# Patient Record
Sex: Male | Born: 1950 | Race: White | Hispanic: No | Marital: Married | Smoking: Former smoker
Health system: Southern US, Community
[De-identification: ages and names within clinical notes are randomized; demographics above are authoritative.]

## PROBLEM LIST (undated history)

## (undated) DIAGNOSIS — C801 Malignant (primary) neoplasm, unspecified: Secondary | ICD-10-CM

## (undated) DIAGNOSIS — T8859XA Other complications of anesthesia, initial encounter: Secondary | ICD-10-CM

## (undated) DIAGNOSIS — I10 Essential (primary) hypertension: Secondary | ICD-10-CM

## (undated) DIAGNOSIS — M1990 Unspecified osteoarthritis, unspecified site: Secondary | ICD-10-CM

## (undated) DIAGNOSIS — G4730 Sleep apnea, unspecified: Secondary | ICD-10-CM

## (undated) DIAGNOSIS — R7303 Prediabetes: Secondary | ICD-10-CM

## (undated) DIAGNOSIS — E7800 Pure hypercholesterolemia, unspecified: Secondary | ICD-10-CM

## (undated) DIAGNOSIS — K219 Gastro-esophageal reflux disease without esophagitis: Secondary | ICD-10-CM

## (undated) DIAGNOSIS — T4145XA Adverse effect of unspecified anesthetic, initial encounter: Secondary | ICD-10-CM

## (undated) HISTORY — PX: SHOULDER SURGERY: SHX246

## (undated) HISTORY — PX: APPENDECTOMY: SHX54

## (undated) HISTORY — PX: HIP OPEN REDUCTION: SHX1755

## (undated) HISTORY — PX: HIP SURGERY: SHX245

## (undated) HISTORY — PX: FOOT SURGERY: SHX648

## (undated) HISTORY — PX: JOINT REPLACEMENT: SHX530

---

## 1997-12-17 ENCOUNTER — Observation Stay (HOSPITAL_COMMUNITY): Admission: EM | Admit: 1997-12-17 | Discharge: 1997-12-17 | Payer: Self-pay | Admitting: Emergency Medicine

## 1997-12-17 ENCOUNTER — Encounter: Payer: Self-pay | Admitting: Emergency Medicine

## 2001-02-22 ENCOUNTER — Encounter: Payer: Self-pay | Admitting: Emergency Medicine

## 2001-02-22 ENCOUNTER — Emergency Department (HOSPITAL_COMMUNITY): Admission: EM | Admit: 2001-02-22 | Discharge: 2001-02-22 | Payer: Self-pay | Admitting: Emergency Medicine

## 2007-12-05 ENCOUNTER — Emergency Department (HOSPITAL_BASED_OUTPATIENT_CLINIC_OR_DEPARTMENT_OTHER): Admission: EM | Admit: 2007-12-05 | Discharge: 2007-12-05 | Payer: Self-pay | Admitting: Emergency Medicine

## 2010-07-29 ENCOUNTER — Emergency Department (HOSPITAL_BASED_OUTPATIENT_CLINIC_OR_DEPARTMENT_OTHER)
Admission: EM | Admit: 2010-07-29 | Discharge: 2010-07-29 | Disposition: A | Discharge status: Home / Self Care | Payer: BC Managed Care – PPO | Attending: Emergency Medicine | Admitting: Emergency Medicine

## 2010-07-29 ENCOUNTER — Encounter: Payer: Self-pay | Admitting: Emergency Medicine

## 2010-07-29 ENCOUNTER — Emergency Department (INDEPENDENT_AMBULATORY_CARE_PROVIDER_SITE_OTHER): Payer: BC Managed Care – PPO

## 2010-07-29 DIAGNOSIS — R071 Chest pain on breathing: Secondary | ICD-10-CM | POA: Insufficient documentation

## 2010-07-29 DIAGNOSIS — R05 Cough: Secondary | ICD-10-CM | POA: Insufficient documentation

## 2010-07-29 DIAGNOSIS — S2239XA Fracture of one rib, unspecified side, initial encounter for closed fracture: Secondary | ICD-10-CM

## 2010-07-29 DIAGNOSIS — Z79899 Other long term (current) drug therapy: Secondary | ICD-10-CM | POA: Insufficient documentation

## 2010-07-29 DIAGNOSIS — K219 Gastro-esophageal reflux disease without esophagitis: Secondary | ICD-10-CM | POA: Insufficient documentation

## 2010-07-29 DIAGNOSIS — R059 Cough, unspecified: Secondary | ICD-10-CM | POA: Insufficient documentation

## 2010-07-29 DIAGNOSIS — Z9889 Other specified postprocedural states: Secondary | ICD-10-CM | POA: Insufficient documentation

## 2010-07-29 DIAGNOSIS — Z8613 Personal history of malaria: Secondary | ICD-10-CM | POA: Insufficient documentation

## 2010-07-29 DIAGNOSIS — M810 Age-related osteoporosis without current pathological fracture: Secondary | ICD-10-CM | POA: Insufficient documentation

## 2010-07-29 DIAGNOSIS — X500XXA Overexertion from strenuous movement or load, initial encounter: Secondary | ICD-10-CM | POA: Insufficient documentation

## 2010-07-29 DIAGNOSIS — I10 Essential (primary) hypertension: Secondary | ICD-10-CM | POA: Insufficient documentation

## 2010-07-29 DIAGNOSIS — R10819 Abdominal tenderness, unspecified site: Secondary | ICD-10-CM | POA: Insufficient documentation

## 2010-07-29 DIAGNOSIS — J40 Bronchitis, not specified as acute or chronic: Secondary | ICD-10-CM

## 2010-07-29 DIAGNOSIS — R109 Unspecified abdominal pain: Secondary | ICD-10-CM | POA: Insufficient documentation

## 2010-07-29 DIAGNOSIS — X58XXXA Exposure to other specified factors, initial encounter: Secondary | ICD-10-CM

## 2010-07-29 HISTORY — DX: Essential (primary) hypertension: I10

## 2010-07-29 HISTORY — DX: Gastro-esophageal reflux disease without esophagitis: K21.9

## 2010-07-29 MED ORDER — IBUPROFEN 800 MG PO TABS: 800.0000 mg | ORAL_TABLET | Freq: Three times a day (TID) | ORAL | Status: AC

## 2010-07-29 MED ORDER — ALBUTEROL SULFATE HFA 108 (90 BASE) MCG/ACT IN AERS: 1.0000 | INHALATION_SPRAY | Freq: Four times a day (QID) | RESPIRATORY_TRACT | Status: DC | PRN

## 2010-07-29 MED ORDER — HYDROCODONE-ACETAMINOPHEN 5-500 MG PO TABS: 1.0000 | ORAL_TABLET | Freq: Four times a day (QID) | ORAL | Status: AC | PRN

## 2010-07-29 MED ORDER — KETOROLAC TROMETHAMINE 60 MG/2ML IM SOLN
60.0000 mg | Freq: Once | INTRAMUSCULAR | Status: AC
  Administered 2010-07-29: 60 mg via INTRAMUSCULAR
  Filled 2010-07-29: qty 2

## 2010-07-29 NOTE — ED Notes (Signed)
Pt states he has been having cold symptoms x 2 weeks, with coughing.  Non- productive.  Pt having chest discomfort with cough.  Some fever.  Now having right upper quad abd pain/right rib pain with coughing today.

## 2010-07-29 NOTE — ED Provider Notes (Signed)
History     Chief Complaint  Patient presents with  . Cough  . Pleurisy   HPI Comments: Patient states that after a world tour of both in the in [place] in the last month he has developed a cough. Originally it was related with a fever. The fever has now gone away and he has been left with a mild nonproductive cough which seems to be improving with over-the-counter pain medications. This morning when he had a coughing fit he noticed that he developed right-sided pain that has been moderate. The pain is worse with palpation of the right chest and upper abdomen and worse with deep breathing. It is a sharp pain.  Patient is a 60 y.o. male presenting with cough. The history is provided by the patient and a relative.  Cough Chronicity: Subacute. The current episode started 1 to 2 hours ago. The problem occurs constantly. The problem has not changed since onset.The cough is non-productive. There has been no fever. Associated symptoms include chest pain. Pertinent negatives include no chills, no sweats, no rhinorrhea, no sore throat and no wheezing. Treatments tried: Over-the-counter medicines Eucalyptus drops. The treatment provided mild relief. His past medical history does not include pneumonia or COPD.    Past Medical History  Diagnosis Date  . Hypertension   . GERD (gastroesophageal reflux disease)   . Osteoporosis   . Malaria     Past Surgical History  Procedure Date  . Hip open reduction   . Appendectomy   . Joint replacement     History reviewed. No pertinent family history.  History  Substance Use Topics  . Smoking status: Never Smoker   . Smokeless tobacco: Not on file  . Alcohol Use: Yes     occasional      Review of Systems  Constitutional: Negative for chills.  HENT: Negative for sore throat and rhinorrhea.   Respiratory: Positive for cough. Negative for wheezing.   Cardiovascular: Positive for chest pain.    Physical Exam  BP 154/92  Pulse 72  Temp(Src) 97.4  F (36.3 C) (Oral)  Resp 22  Wt 246 lb (111.585 kg)  SpO2 97%  Physical Exam  Nursing note and vitals reviewed. Constitutional: He appears well-developed and well-nourished. No distress.  HENT:  Head: Normocephalic and atraumatic.  Mouth/Throat: Oropharynx is clear and moist. No oropharyngeal exudate.  Eyes: Conjunctivae and EOM are normal. Pupils are equal, round, and reactive to light. Right eye exhibits no discharge. Left eye exhibits no discharge. No scleral icterus.  Neck: Normal range of motion. Neck supple. No JVD present. No thyromegaly present.  Cardiovascular: Normal rate, regular rhythm, normal heart sounds and intact distal pulses.  Exam reveals no gallop and no friction rub.   No murmur heard. Pulmonary/Chest: Effort normal and breath sounds normal. No respiratory distress. He has no wheezes. He has no rales. He exhibits tenderness ( EndoTIP outpatient over the right lower anterior and lateral chest wall.).  Abdominal: Soft. Bowel sounds are normal. He exhibits no distension and no mass. There is tenderness.       Tenderness to the right upper quadrant right anterior abdominal wall. Positive carnett's sign   Musculoskeletal: Normal range of motion. He exhibits no edema and no tenderness.  Lymphadenopathy:    He has no cervical adenopathy.  Neurological: He is alert. Coordination normal.  Skin: Skin is warm and dry. No rash noted. He is not diaphoretic. No erythema.  Psychiatric: He has a normal mood and affect. His behavior is  normal.    ED Course  Procedures  MDM Patient has acute onset of right lower chest pain and right upper abdominal pain related to coughing. I suspect that he is strained a muscle in his body wall or possibly had a small fracture of her rib. Will obtain x-ray imaging and give intramuscular Toradol for pain relief.   Findings reviewed with patient, incentive spirometry were given, pain medicine given in the emergency department.  Definitive  Fracture Care:  Definitive fracture care performred for therib.  This included analgesia in the ED, incentive spiromoterly, and pain medication which have been provided.  I have counseled the pt on possible complications of the fractures and signs and symptoms which would mandate return for further care.   Vida Roller, MD 07/29/10 [phone]

## 2012-01-15 DIAGNOSIS — C801 Malignant (primary) neoplasm, unspecified: Secondary | ICD-10-CM

## 2012-01-15 HISTORY — DX: Malignant (primary) neoplasm, unspecified: C80.1

## 2012-03-14 HISTORY — PX: PROSTATECTOMY: SHX69

## 2012-07-09 ENCOUNTER — Encounter (HOSPITAL_COMMUNITY): Payer: Self-pay | Admitting: Pharmacy Technician

## 2012-07-13 ENCOUNTER — Encounter (HOSPITAL_COMMUNITY): Payer: Self-pay

## 2012-07-13 ENCOUNTER — Encounter (HOSPITAL_COMMUNITY)
Admission: RE | Admit: 2012-07-13 | Discharge: 2012-07-13 | Disposition: A | Discharge status: Home / Self Care | Payer: BC Managed Care – PPO | Source: Ambulatory Visit | Attending: Orthopedic Surgery | Admitting: Orthopedic Surgery

## 2012-07-13 DIAGNOSIS — X58XXXA Exposure to other specified factors, initial encounter: Secondary | ICD-10-CM | POA: Insufficient documentation

## 2012-07-13 DIAGNOSIS — Z01812 Encounter for preprocedural laboratory examination: Secondary | ICD-10-CM | POA: Insufficient documentation

## 2012-07-13 DIAGNOSIS — S7290XA Unspecified fracture of unspecified femur, initial encounter for closed fracture: Secondary | ICD-10-CM | POA: Insufficient documentation

## 2012-07-13 HISTORY — DX: Other complications of anesthesia, initial encounter: T88.59XA

## 2012-07-13 HISTORY — DX: Pure hypercholesterolemia, unspecified: E78.00

## 2012-07-13 HISTORY — DX: Adverse effect of unspecified anesthetic, initial encounter: T41.45XA

## 2012-07-13 HISTORY — DX: Sleep apnea, unspecified: G47.30

## 2012-07-13 HISTORY — DX: Unspecified osteoarthritis, unspecified site: M19.90

## 2012-07-13 HISTORY — DX: Malignant (primary) neoplasm, unspecified: C80.1

## 2012-07-13 LAB — CBC
Hemoglobin: 14.8 g/dL (ref 13.0–17.0)
MCHC: 33.9 g/dL (ref 30.0–36.0)
RBC: 5.06 MIL/uL (ref 4.22–5.81)
WBC: 9.4 10*3/uL (ref 4.0–10.5)

## 2012-07-13 LAB — BASIC METABOLIC PANEL
GFR calc Af Amer: >90 mL/min (ref >90)
GFR calc non Af Amer: >90 mL/min (ref >90)
Potassium: 3.6 mEq/L (ref 3.5–5.1)
Sodium: 138 mEq/L (ref 135–145)

## 2012-07-13 LAB — URINALYSIS, ROUTINE W REFLEX MICROSCOPIC
Bilirubin Urine: NEGATIVE
Nitrite: NEGATIVE
Specific Gravity, Urine: 1.029 (ref 1.005–1.030)
Urobilinogen, UA: 0.2 mg/dL (ref 0.0–1.0)

## 2012-07-13 LAB — APTT: aPTT: 29 seconds (ref 24–37)

## 2012-07-13 LAB — SURGICAL PCR SCREEN
MRSA, PCR: NEGATIVE
Staphylococcus aureus: NEGATIVE

## 2012-07-13 LAB — PROTIME-INR
INR: 1.05 (ref 0.00–1.49)
Prothrombin Time: 13.5 seconds (ref 11.6–15.2)

## 2012-07-13 NOTE — Progress Notes (Signed)
Surgery clearance note Dr. Riley Nearing on chart, EKG 11/01/11 and 03/19/12 on chart, Chest x-ray 03/19/12 on chart, CBC with diff and CMET results 06/29/12 on chart, [facility] chart information on chart, LOV note 11/01/11 Dr. Riley Nearing on chart

## 2012-07-13 NOTE — Patient Instructions (Addendum)
20 Gregory Norton  07/13/2012   Your procedure is scheduled on: 07/20/12  Report to [facility] at 7:30 AM.  Call this number if you have problems the morning of surgery [phone]: [phone]   Remember:   Do not eat food or drink liquids After Midnight.     Take these medicines the morning of surgery with A SIP OF WATER: protonix   Do not wear jewelry, make-up or nail polish.  Do not wear lotions, powders, or perfumes. You may wear deodorant.  Do not shave 48 hours prior to surgery. Men may shave face and neck.  Do not bring valuables to the hospital.  Contacts, dentures or bridgework may not be worn into surgery.  Leave suitcase in the car. After surgery it may be brought to your room.  For patients admitted to the hospital, checkout time is 11:00 AM the day of discharge.    Please read over the following fact sheets that you were given: MRSA Information, blood fact sheet, incentive spirometry fact sheet Birdie Sons, RN  pre op nurse call if needed [phone]    FAILURE TO FOLLOW THESE INSTRUCTIONS MAY RESULT IN CANCELLATION OF YOUR SURGERY   Patient Signature: ___________________________________________

## 2012-07-19 NOTE — H&P (Signed)
TOTAL HIP REVISION ADMISSION H&P  Patient is admitted for left revision total hip arthroplasty, with ORIF.  Subjective:  Chief Complaint: left hip pain  HPI: Gregory Norton, 62 y.o. male, has a history of pain and functional disability in the left hip due to multiple dislocations as well as multiple fractures and patient has failed non-surgical conservative treatments for greater than 12 weeks to include NSAID's and/or analgesics, flexibility and strengthening excercises, supervised PT with diminished ADL's post treatment, use of assistive devices and activity modification. The indications for the revision total hip arthroplasty are fracture or mechanical failure of one or more component.  Onset of symptoms was abrupt starting >10 years ago with rapidlly worsening course since that time.  Prior procedures on the left hip include arthroscopy and ORIF with plate, screws and cables.  Patient currently rates pain in the left hip at 9 out of 10 with activity.  There is night pain, worsening of pain with activity and weight bearing, trendelenberg gait, pain that interfers with activities of daily living and pain with passive range of motion. Patient has evidence of the previous total hip arthroplasy as well as plate, screws and cables by imaging studies.  This condition presents safety issues increasing the risk of falls.  This patient has had proximal femur fracture and multiple dislocations.  There is no current active signs of infection.  Risks, benefits and expectations were discussed with the patient. Patient understand the risks, benefits and expectations and wishes to proceed with surgery.  D/C Plans:   Home with HHPT  Post-op Meds:    Rx given for ASA, Zanaflex, Iron, Colace and MiraLax  Tranexamic Acid:   To be given  Decadron:    To be given  FYI:    ASA post-op   Past Medical History  Diagnosis Date  . Hypertension   . GERD (gastroesophageal reflux disease)   . Osteoporosis   .  Hypercholesteremia   . Sleep apnea     no CPAP  . Cancer 2014    prostate cancer  . Arthritis   . Complication of anesthesia     low O2 sats, could not wake up after hip surgery    Past Surgical History  Procedure Laterality Date  . Hip open reduction Left   . Appendectomy    . Joint replacement    . Prostatectomy  March 2014  . Foot surgery Bilateral 10 years ago    6 surgeries after crushing both feet  . Shoulder surgery Right at 62 years old  . Hip surgery Right at 63 years old    No Known Allergies   History  Substance Use Topics  . Smoking status: Former Smoker -- 15 years    Types: Cigarettes    Quit date: 01/14/1978  . Smokeless tobacco: Never Used  . Alcohol Use: Yes     Comment: social    No family history on file.    Review of Systems  Constitutional: Negative.   HENT: Negative.   Eyes: Negative.   Respiratory: Negative.   Cardiovascular: Negative.   Gastrointestinal: Positive for heartburn.  Genitourinary: Positive for frequency.  Musculoskeletal: Positive for joint pain.  Skin: Negative.   Neurological: Negative.   Endo/Heme/Allergies: Negative.   Psychiatric/Behavioral: Negative.     Objective:  Physical Exam  Constitutional: He is oriented to person, place, and time. He appears well-developed and well-nourished.  HENT:  Head: Normocephalic and atraumatic.  Mouth/Throat: Oropharynx is clear and moist.  Eyes: Pupils are equal,  round, and reactive to light.  Neck: Neck supple. No JVD present. No tracheal deviation present. No thyromegaly present.  Cardiovascular: Normal rate, regular rhythm, normal heart sounds and intact distal pulses.   Respiratory: Effort normal and breath sounds normal. No stridor. No respiratory distress. He has no wheezes.  GI: Soft. There is no tenderness. There is no guarding.  Musculoskeletal:       Left hip: He exhibits decreased range of motion, decreased strength, tenderness, bony tenderness, swelling and  laceration. He exhibits no crepitus and no deformity.  Lymphadenopathy:    He has no cervical adenopathy.  Neurological: He is alert and oriented to person, place, and time.  Skin: Skin is warm and dry.  Psychiatric: He has a normal mood and affect.    Imaging Review:  Plain radiographs demonstrate previous total hip arthroplasty, show nonunion of a periprosthetic fracture that was ORIF with a plate, cables and screws of the left hip(s). The bone quality appears to be fair for age and reported activity level.   Assessment/Plan:  End stage arthritis, left hip(s) with failed previous arthroplasty.  The patient history, physical examination, clinical judgement of the provider and imaging studies are consistent with end stage degenerative joint disease of the left hip(s), previous total hip arthroplasty and ORIF from fractures. Revision total hip arthroplasty with ORIF is deemed medically necessary. The treatment options including medical management, injection therapy, arthroscopy and arthroplasty were discussed at length. The risks and benefits of total hip arthroplasty were presented and reviewed. The risks due to aseptic loosening, infection, stiffness, dislocation/subluxation,  thromboembolic complications and other imponderables were discussed.  The patient acknowledged the explanation, agreed to proceed with the plan and consent was signed. Patient is being admitted for inpatient treatment for surgery, pain control, PT, OT, prophylactic antibiotics, VTE prophylaxis, progressive ambulation and ADL's and discharge planning. The patient is planning to be discharged home with home health services.     [name]   PAC  07/19/2012, 10:04 AM

## 2012-07-20 ENCOUNTER — Inpatient Hospital Stay (HOSPITAL_COMMUNITY)
Admission: RE | Admit: 2012-07-20 | Discharge: 2012-07-22 | DRG: 817 | Disposition: A | Discharge status: Home Health | Payer: BC Managed Care – PPO | Source: Ambulatory Visit | Attending: Orthopedic Surgery | Admitting: Orthopedic Surgery

## 2012-07-20 ENCOUNTER — Inpatient Hospital Stay (HOSPITAL_COMMUNITY): Payer: BC Managed Care – PPO

## 2012-07-20 ENCOUNTER — Encounter (HOSPITAL_COMMUNITY): Payer: Self-pay | Admitting: *Deleted

## 2012-07-20 ENCOUNTER — Encounter (HOSPITAL_COMMUNITY): Payer: Self-pay | Admitting: Anesthesiology

## 2012-07-20 ENCOUNTER — Encounter (HOSPITAL_COMMUNITY)
Admission: RE | Disposition: A | Discharge status: Home Health | Payer: Self-pay | Source: Ambulatory Visit | Attending: Orthopedic Surgery

## 2012-07-20 ENCOUNTER — Inpatient Hospital Stay (HOSPITAL_COMMUNITY): Payer: BC Managed Care – PPO | Admitting: Anesthesiology

## 2012-07-20 DIAGNOSIS — Z87891 Personal history of nicotine dependence: Secondary | ICD-10-CM

## 2012-07-20 DIAGNOSIS — Y831 Surgical operation with implant of artificial internal device as the cause of abnormal reaction of the patient, or of later complication, without mention of misadventure at the time of the procedure: Secondary | ICD-10-CM | POA: Diagnosis present

## 2012-07-20 DIAGNOSIS — E7800 Pure hypercholesterolemia, unspecified: Secondary | ICD-10-CM | POA: Diagnosis present

## 2012-07-20 DIAGNOSIS — Z01812 Encounter for preprocedural laboratory examination: Secondary | ICD-10-CM

## 2012-07-20 DIAGNOSIS — E871 Hypo-osmolality and hyponatremia: Secondary | ICD-10-CM | POA: Diagnosis not present

## 2012-07-20 DIAGNOSIS — S72009S Fracture of unspecified part of neck of unspecified femur, sequela: Secondary | ICD-10-CM

## 2012-07-20 DIAGNOSIS — W19XXXS Unspecified fall, sequela: Secondary | ICD-10-CM

## 2012-07-20 DIAGNOSIS — E669 Obesity, unspecified: Secondary | ICD-10-CM | POA: Diagnosis present

## 2012-07-20 DIAGNOSIS — D62 Acute posthemorrhagic anemia: Secondary | ICD-10-CM | POA: Diagnosis not present

## 2012-07-20 DIAGNOSIS — K219 Gastro-esophageal reflux disease without esophagitis: Secondary | ICD-10-CM | POA: Diagnosis present

## 2012-07-20 DIAGNOSIS — T84049A Periprosthetic fracture around unspecified internal prosthetic joint, initial encounter: Secondary | ICD-10-CM | POA: Diagnosis present

## 2012-07-20 DIAGNOSIS — G4730 Sleep apnea, unspecified: Secondary | ICD-10-CM | POA: Diagnosis present

## 2012-07-20 DIAGNOSIS — IMO0002 Reserved for concepts with insufficient information to code with codable children: Principal | ICD-10-CM | POA: Diagnosis present

## 2012-07-20 DIAGNOSIS — D500 Iron deficiency anemia secondary to blood loss (chronic): Secondary | ICD-10-CM | POA: Diagnosis not present

## 2012-07-20 DIAGNOSIS — Z96649 Presence of unspecified artificial hip joint: Secondary | ICD-10-CM

## 2012-07-20 DIAGNOSIS — I10 Essential (primary) hypertension: Secondary | ICD-10-CM | POA: Diagnosis present

## 2012-07-20 DIAGNOSIS — Z6835 Body mass index (BMI) 35.0-35.9, adult: Secondary | ICD-10-CM

## 2012-07-20 DIAGNOSIS — M1610 Unilateral primary osteoarthritis, unspecified hip: Secondary | ICD-10-CM | POA: Diagnosis present

## 2012-07-20 DIAGNOSIS — M169 Osteoarthritis of hip, unspecified: Secondary | ICD-10-CM | POA: Diagnosis present

## 2012-07-20 DIAGNOSIS — M810 Age-related osteoporosis without current pathological fracture: Secondary | ICD-10-CM | POA: Diagnosis present

## 2012-07-20 HISTORY — PX: TOTAL HIP REVISION: SHX763

## 2012-07-20 LAB — TYPE AND SCREEN: Antibody Screen: NEGATIVE

## 2012-07-20 SURGERY — TOTAL HIP REVISION
Anesthesia: General | Site: Hip | Laterality: Left | Wound class: Clean

## 2012-07-20 MED ORDER — SIMVASTATIN 10 MG PO TABS
10.0000 mg | ORAL_TABLET | Freq: Every evening | ORAL | Status: DC
  Administered 2012-07-20 – 2012-07-21 (×2): 10 mg via ORAL
  Filled 2012-07-20 (×3): qty 1

## 2012-07-20 MED ORDER — DEXTROSE 5 % IV SOLN
3.0000 g | INTRAVENOUS | Status: AC
  Administered 2012-07-20: 3 g via INTRAVENOUS
  Filled 2012-07-20: qty 3000

## 2012-07-20 MED ORDER — DEXAMETHASONE SODIUM PHOSPHATE 10 MG/ML IJ SOLN
10.0000 mg | Freq: Once | INTRAMUSCULAR | Status: AC
  Administered 2012-07-21: 10 mg via INTRAVENOUS
  Filled 2012-07-20: qty 1

## 2012-07-20 MED ORDER — METOCLOPRAMIDE HCL 5 MG PO TABS
5.0000 mg | ORAL_TABLET | Freq: Three times a day (TID) | ORAL | Status: DC | PRN
  Filled 2012-07-20: qty 2

## 2012-07-20 MED ORDER — METHOCARBAMOL 100 MG/ML IJ SOLN
500.0000 mg | Freq: Four times a day (QID) | INTRAVENOUS | Status: DC | PRN
  Administered 2012-07-20: 500 mg via INTRAVENOUS
  Filled 2012-07-20 (×2): qty 5

## 2012-07-20 MED ORDER — NEOSTIGMINE METHYLSULFATE 1 MG/ML IJ SOLN
INTRAMUSCULAR | Status: DC | PRN
  Administered 2012-07-20: 4 mg via INTRAVENOUS

## 2012-07-20 MED ORDER — HYDROMORPHONE HCL PF 1 MG/ML IJ SOLN
INTRAMUSCULAR | Status: AC
  Administered 2012-07-20: 0.5 mg via INTRAVENOUS
  Filled 2012-07-20: qty 1

## 2012-07-20 MED ORDER — METOCLOPRAMIDE HCL 5 MG/ML IJ SOLN: 5.0000 mg | Freq: Three times a day (TID) | INTRAMUSCULAR | Status: DC | PRN

## 2012-07-20 MED ORDER — ONDANSETRON HCL 4 MG/2ML IJ SOLN
INTRAMUSCULAR | Status: DC | PRN
  Administered 2012-07-20: 4 mg via INTRAVENOUS

## 2012-07-20 MED ORDER — HYDROMORPHONE HCL PF 1 MG/ML IJ SOLN
0.5000 mg | INTRAMUSCULAR | Status: DC | PRN
  Filled 2012-07-20: qty 1

## 2012-07-20 MED ORDER — 0.9 % SODIUM CHLORIDE (POUR BTL) OPTIME
TOPICAL | Status: DC | PRN
  Administered 2012-07-20: 1000 mL

## 2012-07-20 MED ORDER — POLYETHYLENE GLYCOL 3350 17 G PO PACK
17.0000 g | PACK | Freq: Two times a day (BID) | ORAL | Status: DC
  Administered 2012-07-20 – 2012-07-22 (×4): 17 g via ORAL

## 2012-07-20 MED ORDER — PHENOL 1.4 % MT LIQD: 1.0000 | OROMUCOSAL | Status: DC | PRN

## 2012-07-20 MED ORDER — BISACODYL 10 MG RE SUPP: 10.0000 mg | Freq: Every day | RECTAL | Status: DC | PRN

## 2012-07-20 MED ORDER — METHOCARBAMOL 500 MG PO TABS
500.0000 mg | ORAL_TABLET | Freq: Four times a day (QID) | ORAL | Status: DC | PRN
  Administered 2012-07-21 – 2012-07-22 (×5): 500 mg via ORAL
  Filled 2012-07-20 (×5): qty 1

## 2012-07-20 MED ORDER — TRANEXAMIC ACID 100 MG/ML IV SOLN
1000.0000 mg | Freq: Once | INTRAVENOUS | Status: AC
  Administered 2012-07-20: 1000 mg via INTRAVENOUS
  Filled 2012-07-20: qty 10

## 2012-07-20 MED ORDER — DEXAMETHASONE SODIUM PHOSPHATE 10 MG/ML IJ SOLN
10.0000 mg | Freq: Once | INTRAMUSCULAR | Status: AC
  Administered 2012-07-20: 10 mg via INTRAVENOUS

## 2012-07-20 MED ORDER — PANTOPRAZOLE SODIUM 40 MG PO TBEC
40.0000 mg | DELAYED_RELEASE_TABLET | Freq: Every morning | ORAL | Status: DC
  Administered 2012-07-21: 40 mg via ORAL
  Filled 2012-07-20 (×2): qty 1

## 2012-07-20 MED ORDER — CEFAZOLIN SODIUM-DEXTROSE 2-3 GM-% IV SOLR: 2.0000 g | Freq: Four times a day (QID) | INTRAVENOUS | Status: DC

## 2012-07-20 MED ORDER — ONDANSETRON HCL 4 MG/2ML IJ SOLN
4.0000 mg | Freq: Four times a day (QID) | INTRAMUSCULAR | Status: DC | PRN
  Administered 2012-07-21: 4 mg via INTRAVENOUS
  Filled 2012-07-20: qty 2

## 2012-07-20 MED ORDER — MENTHOL 3 MG MT LOZG: 1.0000 | LOZENGE | OROMUCOSAL | Status: DC | PRN

## 2012-07-20 MED ORDER — FLEET ENEMA 7-19 GM/118ML RE ENEM: 1.0000 | ENEMA | Freq: Once | RECTAL | Status: AC | PRN

## 2012-07-20 MED ORDER — HYDROMORPHONE HCL PF 1 MG/ML IJ SOLN
0.2500 mg | INTRAMUSCULAR | Status: DC | PRN
  Administered 2012-07-20 (×3): 0.5 mg via INTRAVENOUS

## 2012-07-20 MED ORDER — GLYCOPYRROLATE 0.2 MG/ML IJ SOLN
INTRAMUSCULAR | Status: DC | PRN
  Administered 2012-07-20: .6 mg via INTRAVENOUS

## 2012-07-20 MED ORDER — CELECOXIB 200 MG PO CAPS
200.0000 mg | ORAL_CAPSULE | Freq: Two times a day (BID) | ORAL | Status: DC
  Administered 2012-07-20 – 2012-07-22 (×4): 200 mg via ORAL
  Filled 2012-07-20 (×5): qty 1

## 2012-07-20 MED ORDER — LACTATED RINGERS IV SOLN
INTRAVENOUS | Status: DC | PRN
  Administered 2012-07-20 (×5): via INTRAVENOUS

## 2012-07-20 MED ORDER — SODIUM CHLORIDE 0.9 % IR SOLN
Status: DC | PRN
  Administered 2012-07-20: 2000 mL

## 2012-07-20 MED ORDER — ALUM & MAG HYDROXIDE-SIMETH 200-200-20 MG/5ML PO SUSP
30.0000 mL | ORAL | Status: DC | PRN
  Administered 2012-07-21: 30 mL via ORAL
  Filled 2012-07-20: qty 30

## 2012-07-20 MED ORDER — PROPOFOL 10 MG/ML IV BOLUS
INTRAVENOUS | Status: DC | PRN
  Administered 2012-07-20: 140 mg via INTRAVENOUS

## 2012-07-20 MED ORDER — PROMETHAZINE HCL 25 MG/ML IJ SOLN: 6.2500 mg | INTRAMUSCULAR | Status: DC | PRN

## 2012-07-20 MED ORDER — ROCURONIUM BROMIDE 100 MG/10ML IV SOLN
INTRAVENOUS | Status: DC | PRN
  Administered 2012-07-20: 30 mg via INTRAVENOUS

## 2012-07-20 MED ORDER — FENTANYL CITRATE 0.05 MG/ML IJ SOLN
INTRAMUSCULAR | Status: DC | PRN
  Administered 2012-07-20: 50 ug via INTRAVENOUS
  Administered 2012-07-20: 100 ug via INTRAVENOUS
  Administered 2012-07-20: 50 ug via INTRAVENOUS
  Administered 2012-07-20 (×2): 25 ug via INTRAVENOUS
  Administered 2012-07-20: 50 ug via INTRAVENOUS
  Administered 2012-07-20 (×2): 25 ug via INTRAVENOUS

## 2012-07-20 MED ORDER — MIDAZOLAM HCL 5 MG/5ML IJ SOLN
INTRAMUSCULAR | Status: DC | PRN
  Administered 2012-07-20: 2 mg via INTRAVENOUS

## 2012-07-20 MED ORDER — SUCCINYLCHOLINE CHLORIDE 20 MG/ML IJ SOLN
INTRAMUSCULAR | Status: DC | PRN
  Administered 2012-07-20: 100 mg via INTRAVENOUS

## 2012-07-20 MED ORDER — LACTATED RINGERS IV SOLN
INTRAVENOUS | Status: DC
  Administered 2012-07-20: 1000 mL via INTRAVENOUS

## 2012-07-20 MED ORDER — EPHEDRINE SULFATE 50 MG/ML IJ SOLN
INTRAMUSCULAR | Status: DC | PRN
  Administered 2012-07-20 (×3): 5 mg via INTRAVENOUS

## 2012-07-20 MED ORDER — ZOLPIDEM TARTRATE 5 MG PO TABS: 5.0000 mg | ORAL_TABLET | Freq: Every evening | ORAL | Status: DC | PRN

## 2012-07-20 MED ORDER — ONDANSETRON HCL 4 MG PO TABS: 4.0000 mg | ORAL_TABLET | Freq: Four times a day (QID) | ORAL | Status: DC | PRN

## 2012-07-20 MED ORDER — DOCUSATE SODIUM 100 MG PO CAPS
100.0000 mg | ORAL_CAPSULE | Freq: Two times a day (BID) | ORAL | Status: DC
  Administered 2012-07-20 – 2012-07-22 (×4): 100 mg via ORAL

## 2012-07-20 MED ORDER — DIPHENHYDRAMINE HCL 25 MG PO CAPS: 25.0000 mg | ORAL_CAPSULE | Freq: Four times a day (QID) | ORAL | Status: DC | PRN

## 2012-07-20 MED ORDER — HYDROCODONE-ACETAMINOPHEN 7.5-325 MG PO TABS
1.0000 | ORAL_TABLET | ORAL | Status: DC
  Administered 2012-07-20: 1 via ORAL
  Administered 2012-07-20 – 2012-07-21 (×4): 2 via ORAL
  Filled 2012-07-20 (×5): qty 2

## 2012-07-20 MED ORDER — ASPIRIN EC 325 MG PO TBEC
325.0000 mg | DELAYED_RELEASE_TABLET | Freq: Two times a day (BID) | ORAL | Status: DC
  Administered 2012-07-21 – 2012-07-22 (×3): 325 mg via ORAL
  Filled 2012-07-20 (×5): qty 1

## 2012-07-20 MED ORDER — FERROUS SULFATE 325 (65 FE) MG PO TABS
325.0000 mg | ORAL_TABLET | Freq: Three times a day (TID) | ORAL | Status: DC
  Administered 2012-07-20 – 2012-07-22 (×6): 325 mg via ORAL
  Filled 2012-07-20 (×8): qty 1

## 2012-07-20 MED ORDER — SODIUM CHLORIDE 0.9 % IV SOLN
100.0000 mL/h | INTRAVENOUS | Status: DC
  Administered 2012-07-20 – 2012-07-21 (×2): 100 mL/h via INTRAVENOUS
  Filled 2012-07-20 (×7): qty 1000

## 2012-07-20 MED ORDER — DILTIAZEM HCL ER COATED BEADS 300 MG PO CP24
300.0000 mg | ORAL_CAPSULE | Freq: Every evening | ORAL | Status: DC
  Administered 2012-07-20 – 2012-07-21 (×2): 300 mg via ORAL
  Filled 2012-07-20 (×3): qty 1

## 2012-07-20 SURGICAL SUPPLY — 60 items
ARTICULEZE HEAD 36 12 (Hips) ×2 IMPLANT
BAG SPEC THK2 15X12 ZIP CLS (MISCELLANEOUS) ×1
BAG ZIPLOCK 12X15 (MISCELLANEOUS) ×2 IMPLANT
BIT DRILL 2.8X128 (BIT) ×1 IMPLANT
BLADE SAW SGTL 18X1.27X75 (BLADE) ×2 IMPLANT
BLADE SAW SGTL 81X20 HD (BLADE) ×1 IMPLANT
BOWL SMART MIX CTS (DISPOSABLE) ×1 IMPLANT
BUR OVAL CARBIDE 4.0 (BURR) ×1 IMPLANT
CABLE (Orthopedic Implant) ×3 IMPLANT
CATH FOLEY 2WAY SLVR  5CC 16FR (CATHETERS) ×1
CATH FOLEY 2WAY SLVR 5CC 16FR (CATHETERS) IMPLANT
CEMENT HV SMART SET (Cement) ×1 IMPLANT
CLOTH BEACON ORANGE TIMEOUT ST (SAFETY) ×2 IMPLANT
DISTAL TAPERED STEM 21X190 HIP (Stem) ×1 IMPLANT
DRAPE INCISE IOBAN 66X45 STRL (DRAPES) ×2 IMPLANT
DRAPE INCISE IOBAN 85X60 (DRAPES) ×2 IMPLANT
DRAPE ORTHO SPLIT 77X108 STRL (DRAPES) ×4
DRAPE POUCH INSTRU U-SHP 10X18 (DRAPES) ×2 IMPLANT
DRAPE SURG 17X11 SM STRL (DRAPES) ×2 IMPLANT
DRAPE SURG ORHT 6 SPLT 77X108 (DRAPES) ×2 IMPLANT
DRAPE U-SHAPE 47X51 STRL (DRAPES) ×2 IMPLANT
DURAPREP 26ML APPLICATOR (WOUND CARE) ×2 IMPLANT
ELECT BLADE TIP CTD 4 INCH (ELECTRODE) ×2 IMPLANT
ELECT REM PT RETURN 9FT ADLT (ELECTROSURGICAL) ×2
ELECTRODE REM PT RTRN 9FT ADLT (ELECTROSURGICAL) ×1 IMPLANT
EVACUATOR 1/8 PVC DRAIN (DRAIN) ×2 IMPLANT
FACESHIELD LNG OPTICON STERILE (SAFETY) ×10 IMPLANT
GAUZE XEROFORM 5X9 LF (GAUZE/BANDAGES/DRESSINGS) ×1 IMPLANT
GLOVE BIOGEL PI IND STRL 7.5 (GLOVE) ×1 IMPLANT
GLOVE BIOGEL PI IND STRL 8 (GLOVE) ×1 IMPLANT
GLOVE BIOGEL PI INDICATOR 7.5 (GLOVE) ×1
GLOVE BIOGEL PI INDICATOR 8 (GLOVE) ×1
GLOVE ECLIPSE 8.0 STRL XLNG CF (GLOVE) ×4 IMPLANT
GLOVE ORTHO TXT STRL SZ7.5 (GLOVE) ×4 IMPLANT
GOWN BRE IMP PREV XXLGXLNG (GOWN DISPOSABLE) ×5 IMPLANT
GOWN STRL NON-REIN LRG LVL3 (GOWN DISPOSABLE) ×5 IMPLANT
HANDPIECE INTERPULSE COAX TIP (DISPOSABLE) ×2
HEAD ARTICULEZE 36 12 (Hips) IMPLANT
KIT BASIN OR (CUSTOM PROCEDURE TRAY) ×2 IMPLANT
LINER NEUTRAL 52X36X52 PLUS 4 (Liner) ×1 IMPLANT
MANIFOLD NEPTUNE II (INSTRUMENTS) ×2 IMPLANT
NS IRRIG 1000ML POUR BTL (IV SOLUTION) ×3 IMPLANT
PACK TOTAL JOINT (CUSTOM PROCEDURE TRAY) ×2 IMPLANT
PASSER SUT SWANSON 36MM LOOP (INSTRUMENTS) IMPLANT
POSITIONER SURGICAL ARM (MISCELLANEOUS) ×2 IMPLANT
RECLAIM PRX BDY CONE 24X105 (Hips) ×1 IMPLANT
SET HNDPC FAN SPRY TIP SCT (DISPOSABLE) IMPLANT
SPONGE GAUZE 4X4 12PLY (GAUZE/BANDAGES/DRESSINGS) ×3 IMPLANT
SPONGE LAP 18X18 X RAY DECT (DISPOSABLE) ×5 IMPLANT
STAPLER VISISTAT 35W (STAPLE) ×3 IMPLANT
SUCTION FRAZIER TIP 10 FR DISP (SUCTIONS) ×2 IMPLANT
SUT ETHIBOND NAB CT1 #1 30IN (SUTURE) IMPLANT
SUT MNCRL AB 4-0 PS2 18 (SUTURE) ×1 IMPLANT
SUT VIC AB 1 CT1 36 (SUTURE) ×7 IMPLANT
SUT VIC AB 2-0 CT1 27 (SUTURE) ×6
SUT VIC AB 2-0 CT1 TAPERPNT 27 (SUTURE) ×3 IMPLANT
SUT VLOC 180 0 24IN GS25 (SUTURE) ×4 IMPLANT
TOWEL OR 17X26 10 PK STRL BLUE (TOWEL DISPOSABLE) ×4 IMPLANT
TRAY FOLEY CATH 14FRSI W/METER (CATHETERS) ×2 IMPLANT
WATER STERILE IRR 1500ML POUR (IV SOLUTION) ×3 IMPLANT

## 2012-07-20 NOTE — Progress Notes (Signed)
Portable AP PELVIS AND LATERAL CROSS-TABLE LEFT HIP X-RAY DONE.

## 2012-07-20 NOTE — Brief Op Note (Signed)
07/20/2012  2:03 PM  PATIENT:  Gregory Norton  62 y.o. 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  PROCEDURE:  Procedure(s): LEFT TOTAL HIP REVISION (Left)  SURGEON:  Surgeon(s) and Role:    * Shelda Pal, MD - Primary  PHYSICIAN ASSISTANT: Lanney Gins, PA-C  ANESTHESIA:   general  EBL:  Total I/O In: 4400 [I.V.:4400] Out: 1700 [Urine:200; Blood:1500]  BLOOD ADMINISTERED:none  DRAINS: (1 medium) Hemovact drain(s) in the left hip with  Suction Open   LOCAL MEDICATIONS USED:  NONE  SPECIMEN:  No Specimen  DISPOSITION OF SPECIMEN:  N/A  COUNTS:  YES  TOURNIQUET:  * No tourniquets in log *  DICTATION: .Other Dictation: Dictation Number [id number]  PLAN OF CARE: Admit to inpatient   PATIENT DISPOSITION:  PACU - hemodynamically stable.   Delay start of Pharmacological VTE agent (>24hrs) due to surgical blood loss or risk of bleeding: no

## 2012-07-20 NOTE — Anesthesia Preprocedure Evaluation (Signed)
Anesthesia Evaluation  Patient identified by MRN, date of birth, ID band Patient awake  General Assessment Comment:.  Hypertension     .  GERD (gastroesophageal reflux disease)     .  Osteoporosis     .  Hypercholesteremia     .  Sleep apnea         no CPAP   .  Cancer  2014       prostate cancer   .  Arthritis     .  Complication of anesthesia         low O2 sats, could not wake up after hip surgery     Reviewed: Allergy & Precautions, H&P , NPO status , Patient's Chart, lab work & pertinent test results  Airway Mallampati: II TM Distance: >3 FB Neck ROM: Full    Dental no notable dental hx.    Pulmonary sleep apnea ,  breath sounds clear to auscultation  Pulmonary exam normal       Cardiovascular Exercise Tolerance: Good hypertension, Pt. on medications negative cardio ROS  Rhythm:Regular Rate:Normal     Neuro/Psych negative neurological ROS  negative psych ROS   GI/Hepatic negative GI ROS, Neg liver ROS, GERD-  Medicated,  Endo/Other  negative endocrine ROS  Renal/GU negative Renal ROS  negative genitourinary   Musculoskeletal negative musculoskeletal ROS (+)   Abdominal (+) + obese,   Peds negative pediatric ROS (+)  Hematology negative hematology ROS (+)   Anesthesia Other Findings   Reproductive/Obstetrics negative OB ROS                           Anesthesia Physical Anesthesia Plan  ASA: III  Anesthesia Plan: General   Post-op Pain Management:    Induction: Intravenous  Airway Management Planned: Oral ETT  Additional Equipment:   Intra-op Plan:   Post-operative Plan: Extubation in OR  Informed Consent: I have reviewed the patients History and Physical, chart, labs and discussed the procedure including the risks, benefits and alternatives for the proposed anesthesia with the patient or authorized representative who has indicated his/her understanding and  acceptance.   Dental advisory given  Plan Discussed with: CRNA  Anesthesia Plan Comments: (Discussed general versus spinal.)        Anesthesia Quick Evaluation

## 2012-07-20 NOTE — Progress Notes (Signed)
Dr. Charlann Boxer aware of X-RAY RESULTS- O.K. TO GO TO FLOOR

## 2012-07-20 NOTE — Interval H&P Note (Signed)
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 After consideration of risks, benefits and other options for treatment, the patient has consented to  Procedure(s): LEFT TOTAL HIP REVISION (Left) OPEN REDUCTION INTERNAL FIXATION (ORIF) FEMUR FRACTURE (Left) as a surgical intervention .  The patient's history has been reviewed, patient examined, no change in status, stable for surgery.  I have reviewed the patient's chart and labs.  Questions were answered to the patient's satisfaction.     Shelda Pal

## 2012-07-20 NOTE — Interval H&P Note (Signed)
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 After consideration of risks, benefits and other options for treatment, the patient has consented to  Procedure(s): LEFT TOTAL HIP REVISION (Left) OPEN REDUCTION INTERNAL FIXATION (ORIF) FEMUR FRACTURE (Left) as a surgical intervention .  The patient's history has been reviewed, patient examined, no change in status, stable for surgery.  I have reviewed the patient's chart and labs.  Questions were answered to the patient's satisfaction.     Gregory Norton

## 2012-07-20 NOTE — Anesthesia Postprocedure Evaluation (Signed)
  Anesthesia Post-op Note  Patient: Gregory Norton  Procedure(s) Performed: Procedure(s) (LRB): LEFT TOTAL HIP REVISION (Left)  Patient Location: PACU  Anesthesia Type: General  Level of Consciousness: awake and alert   Airway and Oxygen Therapy: Patient Spontanous Breathing  Post-op Pain: mild  Post-op Assessment: Post-op Vital signs reviewed, Patient's Cardiovascular Status Stable, Respiratory Function Stable, Patent Airway and No signs of Nausea or vomiting  Last Vitals:  Filed Vitals:   07/20/12 1551  BP: 126/71  Pulse: 90  Temp: 36.6 C  Resp: 15    Post-op Vital Signs: stable   Complications: No apparent anesthesia complications

## 2012-07-20 NOTE — Preoperative (Signed)
Beta Blockers   Reason not to administer Beta Blockers:Not Applicable 

## 2012-07-20 NOTE — Transfer of Care (Signed)
Immediate Anesthesia Transfer of Care Note  Patient: Gregory Norton  Procedure(s) Performed: Procedure(s): LEFT TOTAL HIP REVISION (Left)  Patient Location: PACU  Anesthesia Type:General  Level of Consciousness: awake, alert , oriented and patient cooperative  Airway & Oxygen Therapy: Patient Spontanous Breathing and Patient connected to face mask oxygen  Post-op Assessment: Report given to PACU RN and Post -op Vital signs reviewed and stable  Post vital signs: Reviewed and stable  Complications: No apparent anesthesia complications

## 2012-07-21 DIAGNOSIS — E669 Obesity, unspecified: Secondary | ICD-10-CM | POA: Diagnosis present

## 2012-07-21 DIAGNOSIS — E871 Hypo-osmolality and hyponatremia: Secondary | ICD-10-CM | POA: Diagnosis not present

## 2012-07-21 DIAGNOSIS — D500 Iron deficiency anemia secondary to blood loss (chronic): Secondary | ICD-10-CM | POA: Diagnosis not present

## 2012-07-21 LAB — CBC
Hemoglobin: 9.3 g/dL — ABNORMAL LOW (ref 13.0–17.0)
MCH: 29.2 pg (ref 26.0–34.0)
RBC: 3.18 MIL/uL — ABNORMAL LOW (ref 4.22–5.81)
WBC: 18 10*3/uL — ABNORMAL HIGH (ref 4.0–10.5)

## 2012-07-21 LAB — BASIC METABOLIC PANEL
CO2: 28 mEq/L (ref 19–32)
Calcium: 8.3 mg/dL — ABNORMAL LOW (ref 8.4–10.5)
Chloride: 100 mEq/L (ref 96–112)
Glucose, Bld: 159 mg/dL — ABNORMAL HIGH (ref 70–99)
Sodium: 133 mEq/L — ABNORMAL LOW (ref 135–145)

## 2012-07-21 MED ORDER — POLYETHYLENE GLYCOL 3350 17 G PO PACK: 17.0000 g | PACK | Freq: Two times a day (BID) | ORAL | Status: DC

## 2012-07-21 MED ORDER — PANTOPRAZOLE SODIUM 40 MG PO TBEC
80.0000 mg | DELAYED_RELEASE_TABLET | Freq: Every morning | ORAL | Status: DC
  Administered 2012-07-21: 40 mg via ORAL
  Administered 2012-07-22: 80 mg via ORAL
  Filled 2012-07-21: qty 2

## 2012-07-21 MED ORDER — CEFAZOLIN SODIUM-DEXTROSE 2-3 GM-% IV SOLR
2.0000 g | Freq: Once | INTRAVENOUS | Status: AC
  Administered 2012-07-21: 2 g via INTRAVENOUS
  Filled 2012-07-21: qty 50

## 2012-07-21 MED ORDER — FERROUS SULFATE 325 (65 FE) MG PO TABS: 325.0000 mg | ORAL_TABLET | Freq: Three times a day (TID) | ORAL | Status: DC

## 2012-07-21 MED ORDER — DSS 100 MG PO CAPS: 100.0000 mg | ORAL_CAPSULE | Freq: Two times a day (BID) | ORAL | Status: DC

## 2012-07-21 MED ORDER — ASPIRIN 325 MG PO TBEC: 325.0000 mg | DELAYED_RELEASE_TABLET | Freq: Two times a day (BID) | ORAL | Status: AC

## 2012-07-21 MED ORDER — HYDROCODONE-ACETAMINOPHEN 7.5-325 MG PO TABS: 1.0000 | ORAL_TABLET | ORAL | Status: DC | PRN

## 2012-07-21 MED ORDER — SODIUM CHLORIDE 0.9 % IV BOLUS (SEPSIS)
500.0000 mL | Freq: Once | INTRAVENOUS | Status: AC
  Administered 2012-07-21: 500 mL via INTRAVENOUS

## 2012-07-21 MED ORDER — TRAMADOL HCL 50 MG PO TABS
50.0000 mg | ORAL_TABLET | Freq: Four times a day (QID) | ORAL | Status: DC | PRN
  Administered 2012-07-21 – 2012-07-22 (×5): 100 mg via ORAL
  Filled 2012-07-21 (×5): qty 2

## 2012-07-21 NOTE — Progress Notes (Signed)
   Subjective: 1 Day Post-Op Procedure(s) (LRB): LEFT TOTAL HIP REVISION (Left)   Patient reports pain as mild, pain well controlled. Little dizzy when he sat on the side of the bed, but otherwise no events. Feels that if he does well with PT and pain stays well controlled that he is to be d/c'ed home.  Objective:   VITALS:   Filed Vitals:   07/21/12 0504  BP: 121/79  Pulse: 78  Temp: 97.9 F (36.6 C)  Resp: 16    Neurovascular intact Dorsiflexion/Plantar flexion intact Incision: dressing C/D/I No cellulitis present Compartment soft  LABS  Recent Labs  07/21/12 0451  HGB 9.3*  HCT 27.5*  WBC 18.0*  PLT 231     Recent Labs  07/21/12 0451  NA 133*  K 4.6  BUN 18  CREATININE 0.89  GLUCOSE 159*     Assessment/Plan: 1 Day Post-Op Procedure(s) (LRB): LEFT TOTAL HIP REVISION (Left) HV drain d/c'ed Foley cath d/c'ed Advance diet Up with therapy D/C IV fluids Discharge home with home health Follow up in 2 weeks at [facility]. Follow up with OLIN,Ruffin Lada D in 2 weeks.  Contact information:  [facility] [address], [address] [place] [address] [phone]    Expected ABLA  Treated with iron and will observe  Obese (BMI 30-39.9) Estimated body mass index is 35.04 kg/(m^2) as calculated from the following:   Height as of this encounter: 6\' 2"  (1.88 m).   Weight as of this encounter: 123.832 kg (273 lb). Patient also counseled that weight may inhibit the healing process Patient counseled that losing weight will help with future health issues  Hyponatremia Treated with IV fluids       Anastasio Auerbach. Tessla Spurling   PAC  07/21/2012, 8:00 AM

## 2012-07-21 NOTE — Evaluation (Signed)
Physical Therapy Evaluation Patient Details Name: Arihaan Bellucci MRN: [id number] DOB: Oct 14, 1950 Today's Date: 07/21/2012 Time: 1191-4782 PT Time Calculation (min): 35 min  PT Assessment / Plan / Recommendation History of Present Illness  Sierra Bissonette, 62 y.o. male, has a history of pain and functional disability in the left hip due to multiple dislocations as well as multiple fractures and patient has failed non-surgical conservative treatments for greater than 12 weeks to include NSAID's and/or analgesics, flexibility and strengthening excercises, supervised PT with diminished ADL's post treatment, use of assistive devices and activity modification. The indications for the revision total hip arthroplasty are fracture or mechanical failure of one or more component.  Onset of symptoms was abrupt starting >10 years ago with rapidlly worsening course since that time.  Prior procedures on the left hip include arthroscopy and ORIF with plate, screws and cables.  Patient currently rates pain in the left hip at 9 out of 10 with activity.  There is night pain, worsening of pain with activity and weight bearing, trendelenberg gait, pain that interfers with activities of daily living and pain with passive range of motion. Patient has evidence of the previous total hip arthroplasy as well as plate, screws and cables by imaging studies.  This condition presents safety issues increasing the risk of falls.  This patient has had proximal femur fracture and multiple dislocations.  There is no current active signs of infection.  Risks, benefits and expectations were discussed with the patient. 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  Clinical Impression  Pt has been bothered by reflux and nausea. After ambulation, pt felt much better, expelling gas while mobilizing. Pt plans DC to home. Recommend pt have another day of PT to  ensure mobility and safety.    PT Assessment  Patient needs continued PT services    Follow Up Recommendations  Home health PT    Does the patient have the potential to tolerate intense rehabilitation      Barriers to Discharge        Equipment Recommendations  None recommended by PT    Recommendations for Other Services     Frequency 7X/week    Precautions / Restrictions Precautions Precautions: Posterior Hip Restrictions LLE Weight Bearing: Partial weight bearing LLE Partial Weight Bearing Percentage or Pounds: 50%   Pertinent Vitals/Pain 129/88-pre 115/80 Post, no dizziness.      Mobility  Bed Mobility Bed Mobility: Supine to Sit Supine to Sit: 4: Min assist;HOB elevated Details for Bed Mobility Assistance: cues for precautions of L hip. support of LLE  Transfers Transfers: Sit to Stand;Stand to Sit Sit to Stand: From bed;With upper extremity assist;From elevated surface Stand to Sit: To chair/3-in-1;With armrests;With upper extremity assist;4: Min assist Details for Transfer Assistance: cues for hand and LLE position and precautions Ambulation/Gait Ambulation/Gait Assistance: 1: +2 Total assist Ambulation/Gait: Patient Percentage: 80% Ambulation Distance (Feet): 75 Feet Assistive device: Rolling walker Ambulation/Gait Assistance Details: cues for sequence  and PWB. Gait Pattern: Step-through pattern;Decreased step length - left    Exercises     PT Diagnosis: Difficulty walking;Acute pain  PT Problem List: Decreased strength;Decreased safety awareness;Decreased range of motion;Decreased activity tolerance;Decreased mobility;Decreased knowledge of use of DME;Decreased knowledge of precautions;Pain PT Treatment Interventions: DME instruction;Gait training;Functional mobility training;Stair training;Therapeutic activities;Therapeutic exercise;Patient/family education     PT Goals(Current goals can be found in the care plan section) Acute Rehab PT  Goals Patient  Stated Goal: I want to get this femur fixed and get on with my life. PT Goal Formulation: With patient Time For Goal Achievement: 07/28/12 Potential to Achieve Goals: Good  Visit Information  Last PT Received On: 07/21/12 Assistance Needed: +1 PT/OT Co-Evaluation/Treatment: Yes History of Present Illness: Shivam Mestas, 62 y.o. male, has a history of pain and functional disability in the left hip due to multiple dislocations as well as multiple fractures and patient has failed non-surgical conservative treatments for greater than 12 weeks to include NSAID's and/or analgesics, flexibility and strengthening excercises, supervised PT with diminished ADL's post treatment, use of assistive devices and activity modification. The indications for the revision total hip arthroplasty are fracture or mechanical failure of one or more component.  Onset of symptoms was abrupt starting >10 years ago with rapidlly worsening course since that time.  Prior procedures on the left hip include arthroscopy and ORIF with plate, screws and cables.  Patient currently rates pain in the left hip at 9 out of 10 with activity.  There is night pain, worsening of pain with activity and weight bearing, trendelenberg gait, pain that interfers with activities of daily living and pain with passive range of motion. Patient has evidence of the previous total hip arthroplasy as well as plate, screws and cables by imaging studies.  This condition presents safety issues increasing the risk of falls.  This patient has had proximal femur fracture and multiple dislocations.  There is no current active signs of infection.  Risks, benefits and expectations were discussed with the patient. Patient understand the risks, benefits and expectations and wishes to proceed with surgery.       Prior Functioning  Home Living Family/patient expects to be discharged to:: Private residence Living Arrangements: Spouse/significant  other Available Help at Discharge: Family Type of Home: House Home Access: Stairs to enter [employer] of Steps: 1 Home Layout: One level Home Equipment: Crutches;Wheelchair - Economist - 2 wheels;Cane - single point (forearm crutches.) Prior Function Level of Independence: Independent with assistive device(s) Communication Communication: No difficulties    Cognition  Cognition Arousal/Alertness: Awake/alert Behavior During Therapy: WFL for tasks assessed/performed Overall Cognitive Status: Within Functional Limits for tasks assessed    Extremity/Trunk Assessment Upper Extremity Assessment Upper Extremity Assessment: Defer to OT evaluation Lower Extremity Assessment Lower Extremity Assessment: LLE deficits/detail LLE Deficits / Details: pt was able to move Leg to edge of bed with min assistance   Balance    End of Session PT - End of Session Equipment Utilized During Treatment: Gait belt Activity Tolerance: Patient tolerated treatment well Patient left: in chair;with call bell/phone within reach Nurse Communication: Mobility status  GP     Rada Hay 07/21/2012, 1:14 PM  Blanchard Kelch PT [phone]

## 2012-07-21 NOTE — Op Note (Signed)
NAMECORBEN, AUZENNE                 ACCOUNT NO.:  [id number]  MEDICAL RECORD NO.:  [id number]  LOCATION:  1606                         FACILITY:  [facility]  PHYSICIAN:  Madlyn Frankel. Charlann Boxer, M.D.  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  PROCEDURE:  Revision left total hip replacement.  COMPONENTS USED:  Size 52 x 36, +4 10-degree face changing liner cemented into a retained S-ROM 60-mm cup, a size 21 x 190 mm Reclaim stem with a 24 x 105 mm proximal body with a 45 mm neck length a 36+ 12 metal ball.  I also had performed trochanteric osteotomy required cabling fixation.  SURGEON:  Madlyn Frankel. Charlann Boxer, M.D.  ASSISTANT:  Lanney Gins, PA-C.  ANESTHESIA:  General.  SPECIMENS:  None.  COMPLICATION:  None.  Hemovac drain one medium Hemovac placed in the hip joint.  BLOOD LOSS:  About 1500 mL.  FINDINGS:  The patient was noted to have nonunion at the distal portion of his femoral stem with loose allograft segments.  He was also noted to have a stable S-ROM cup.  The cup position appeared to be about 50-60 degrees of abduction and very little if any, forward flexion, as identified intraoperatively.  INDICATION FOR PROCEDURE:  Mr. Cajamarca is a very pleasant 62 year old male who presented for evaluation of persistently painful left hip with inability to bear weight.  Based on the persistence of his pain, he had a periprosthetic femur fracture after fall, treated with an  open reduction and internal fixation.  He was being followed by Dr. Myrene Galas who observed failure at the fracture site.  Due to increasing pain, significant reduction of quality of life, he was referred for surgical consideration.  Given the failure of his open reduction internal fixation, the manner was done and the type of fracture pattern he had.  I discussed with him that the best way to treat this would be with revision surgery to bypass the fracture site to allow his bone to heal to an axial or weight bearing mechanism as opposed to the lateral plate mechanism.  We discussed the risks of failure of the components, risk for infection, dislocation, he had been through before.  Consent was obtained for the benefit of pain relief.  PROCEDURE IN DETAIL:  The patient was brought to the operative theater. Once adequate anesthesia and preoperative antibiotics, the preoperative antibiotics, Ancef, patient was positioned into the right lateral decubitus position with left side up.  The left lower extremity was then prepped and draped in sterile fashion.  A time-out was performed identifying the patient, planned procedure, and extremity.  The patient already had an incision and extended all the way down his lateral side of his thigh.  Initially,  I exposed the proximal aspect of the hip including the previously placed trochanteric plate with a 6.5 cancellous screw.  I removed this screw.  I further dissected out the posterior aspect joint.  I spent a lot of time dissecting out the soft tissues, exposing the posterior 2/3 of the cup.  I removed the 2 rim based screws were holding the S-ROM liner in place.  Following exposure of the hip joint, I extended the incision distally to remove all cables and the remainder of the exposure of the plate.  Once this was all removed, the cable plate was removed as was the fractured piece of allograft bone distally.  There were 3 other cables had  been previously placed were removed as well.  Following the removal of the component, identification nonunion site as well as his exposure of posterior aspect hip, I created a extended trochanteric osteotomy utilizing a drill bit into the distal segment and then providing some anterior saw blade in order to complete the osteotomy.  I was able to elevate the lateral aspect of the osteotomy anteriorly given his previous fractures and there was some comminution of this area but basically an intact segment from the greater trochanter to the osteotomy site.  I then used a thin quarter-inch osteotomes and worked myself around the prosthesis and using a S-ROM extractor, I was able to remove the femoral component from the proximal femur.  Following this, I began working on the femoral component.  I began reaming sequentially on power with 14-mm reamer.  I ended up selecting based on evaluation 190-mm stem prosthesis to provide adequate fixation within the distal femur but also to allow for adequate height of the component.  Once I reamed up to a 20 mm by hand finally, I did a trial.  I prepared to do a trial reduction.  Prior to an effort to do this, I attended to the acetabulum.  Acetabular exposure obtained normal sets of retractors and acetabular previous acetabular liner was removed.  Based on the previous revision surgeries and review the operative notes including the potential use of bone allograft, I was worried despite the fact that his acetabular shell had no real forward flexion and was significantly abducted.  I worried about removing this component about the available bone stock.  This cup had been inside his pelvis now for 14 years without complication or pain.  I made incision at this point in order to change the orientation acetabulum cemented liner in place.  This also would allow me to update the polyethylene from the marathon liner to a Ultrex highly  cross-linked polyethylene in this 62 year old man.  At this point, I selected based on evaluating the trial components, incised this 60 S-ROM cup a 52 x 36 +4 10-degree face changing liner. We opened up a single batch of cement and once it was prepared, I roughened and I created a checked pattern using a high-speed bur on the back side of the polyethylene.  Cement ball was placed into a dried acetabular cup and the liner then held in position using a cup positioner at probably 20 degrees of forward flexion, and now 35 or 40 degrees of abduction.  Once the cement fully cured, we removed a bit of the cement around the rim.  I was able to now focused on the trial reduction.  We attached the 105 mm proximal body and initially a 40 mm offset neck.  With the +8.5 ball, I felt that I was still a  little bit short, however, the range of motion showed no signs of impingement or subluxation with hip flexion, internal rotation in the sleep position.  In the sleep position because of the osteotomy, there was not a mechanical stability to hold the hip in place that could be explaining this was to be expected.  However, based on the trial reduction, I elected to ream up more to a 21-mm stem to provide a little bit extra length.  At this point, I removed the trial components.  We hand reamed to a 21 mm and selected a 21 x 190 mm Reclaim stem, selected a 24 x 105 mm proximal body with the 45-mm neck offset.  These were opened and configured on the back table per protocol.  Given the fact that we are utilizing this as a straight stem, the orientation of anteversion would be based on the impaction of the component.  The final stem was then impacted into the distal femur with approximately 25-30 degrees of anteversion.  Once we impacted it to a level where there was no further movement, I re- trialed but then ended up selecting a +12 ball in order to maximize the stability of the component and tension  on the abductors.  Final 36+ 12 ball was then inserted metal ball was impacted on a clean and dry trunnion.  The hip was reduced.  Again, we assessed range of motion.  There was no evidence of stocking extension.  There was no evidence of subluxation with internal rotation and flexion when in sleep position.  The leg was then placed into an abducted position.  I evaluated the integrity of the trochanteric osteotomy segment removing old nonunited allograft bone from the anterolateral aspect of the femur.  With the osteotomy site laid over top of the proximal femur, I used 3 Zimmer 2.0 mm cables and reapproximated the proximal femur to the prosthetic component and secured it down.  Each cable was tensioned to between 7500 units of pressure for recurrent down and cut.  We irrigated the hip wound with about a liter of normal saline solution plus extra throughout the case.  At this point, the iliotibial band was reapproximated to itself, thus reapproximating the vastus lateralis to over top of the final of the femoral component and the osteotomy site. This was closed with a combination of #1 Vicryl and 0 V-Loc suture x2 from the most distal extent all the way to the posterior aspect of the hip.  The remainder of the wound was closed with 2-0 Vicryl and staples on the skin.  The skin was cleaned, dried, and dressed sterilely using Xeroform gauze and tape.  He was brought to the recovery room, extubated tolerating procedure thus well at this point.  I will at this point make him partial weightbearing up to 50% weightbearing.  We will allow him to do this for probably 4-6 weeks to make certain we have stability of his components bony ingrowth before progressing on due to his size.  I am hopeful that his pain with weightbearing will subside significantly and this will improve his functional abilities.     Madlyn Frankel Charlann Boxer, M.D.     MDO/MEDQ  D:  07/20/2012  T:  07/21/2012  Job:   [id number]

## 2012-07-21 NOTE — Progress Notes (Signed)
Utilization review completed.  

## 2012-07-21 NOTE — Consult Note (Signed)
Spoke to patient concerning DME needs and patient and wife advised me that they have equipment at home.

## 2012-07-21 NOTE — Progress Notes (Signed)
Physical Therapy Treatment Patient Details Name: Gregory Norton MRN: [id number] DOB: 1950-10-12 Today's Date: 07/21/2012 Time: 8657-8469 PT Time Calculation (min): 14 min  PT Assessment / Plan / Recommendation  PT Comments   Pt improving in gait. Minimal c/o pain. HR 110 sats 99 after amb. Plan DC in AM after practice step  Follow Up Recommendations  Home health PT     Does the patient have the potential to tolerate intense rehabilitation     Barriers to Discharge        Equipment Recommendations  None recommended by PT    Recommendations for Other Services    Frequency 7X/week   Progress towards PT Goals Progress towards PT goals: Progressing toward goals  Plan Current plan remains appropriate    Precautions / Restrictions Precautions Precautions: Posterior Hip Restrictions LLE Weight Bearing: Partial weight bearing LLE Partial Weight Bearing Percentage or Pounds: 50%   Pertinent Vitals/Pain     Mobility  Bed Mobility Bed Mobility: Not assessed Transfers Transfers: Sit to Stand;Stand to Sit Sit to Stand: With upper extremity assist;From elevated surface;4: Min assist;From chair/3-in-1 Stand to Sit: With armrests;With upper extremity assist;4: Min guard;To chair/3-in-1 Details for Transfer Assistance: cues for hand and LLE position and precautions Ambulation/Gait Ambulation/Gait Assistance: 4: Min assist Ambulation/Gait: Patient Percentage:  Ambulation Distance (Feet): 180 Feet Assistive device: Rolling walker Ambulation/Gait Assistance Details: cues for stand rest break Gait Pattern: Step-through pattern;Decreased step length - left    Exercises     PT Diagnosis: Difficulty walking;Acute pain  PT Problem List: Decreased strength;Decreased safety awareness;Decreased range of motion;Decreased activity tolerance;Decreased mobility;Decreased knowledge of use of DME;Decreased knowledge of precautions;Pain PT Treatment Interventions: DME instruction;Gait  training;Functional mobility training;Stair training;Therapeutic activities;Therapeutic exercise;Patient/family education   PT Goals (current goals can now be found in the care plan section) Acute Rehab PT Goals Patient Stated Goal: I want to get this femur fixed and get on with my life. PT Goal Formulation: With patient Time For Goal Achievement: 07/28/12 Potential to Achieve Goals: Good Additional Goals Additional Goal #1: demo 3/3 post hip precautions  Visit Information  Last PT Received On: 07/21/12 Assistance Needed: +1 PT/OT Co-Evaluation/Treatment: Yes History of Present Illness: Gregory Norton, 62 y.o. male, has a history of pain and functional disability in the left hip due to multiple dislocations as well as multiple fractures and patient has failed non-surgical conservative treatments for greater than 12 weeks to include NSAID's and/or analgesics, flexibility and strengthening excercises, supervised PT with diminished ADL's post treatment, use of assistive devices and activity modification. The indications for the revision total hip arthroplasty are fracture or mechanical failure of one or more component.  Onset of symptoms was abrupt starting >10 years ago with rapidlly worsening course since that time.  Prior procedures on the left hip include arthroscopy and ORIF with plate, screws and cables.  Patient currently rates pain in the left hip at 9 out of 10 with activity.  There is night pain, worsening of pain with activity and weight bearing, trendelenberg gait, pain that interfers with activities of daily living and pain with passive range of motion. Patient has evidence of the previous total hip arthroplasy as well as plate, screws and cables by imaging studies.  This condition presents safety issues increasing the risk of falls.  This patient has had proximal femur fracture and multiple dislocations.  There is no current active signs of infection.  Risks, benefits and expectations were  discussed with the patient. Patient understand the risks, benefits and expectations  and wishes to proceed with surgery.    Subjective Data  Patient Stated Goal: I want to get this femur fixed and get on with my life.   Cognition  Cognition Arousal/Alertness: Awake/alert Behavior During Therapy: WFL for tasks assessed/performed Overall Cognitive Status: Within Functional Limits for tasks assessed    Balance     End of Session PT - End of Session Equipment Utilized During Treatment: Gait belt Activity Tolerance: Patient tolerated treatment well Patient left: in chair;with call bell/phone within reach Nurse Communication: Mobility status   GP     Rada Hay 07/21/2012, 3:04 PM

## 2012-07-21 NOTE — Evaluation (Addendum)
Occupational Therapy Evaluation Patient Details Name: Gregory Norton MRN: [id number] DOB: 01-01-1951 Today's Date: 07/21/2012 Time: 4098-1191 OT Time Calculation (min): 21 min  OT Assessment / Plan / Recommendation History of present illness Gregory Norton, 62 y.o. male, has a history of pain and functional disability in the left hip due to multiple dislocations as well as multiple fractures and patient has failed non-surgical conservative treatments for greater than 12 weeks to include NSAID's and/or analgesics, flexibility and strengthening excercises, supervised PT with diminished ADL's post treatment, use of assistive devices and activity modification. The indications for the revision total hip arthroplasty are fracture or mechanical failure of one or more component.  Onset of symptoms was abrupt starting >10 years ago with rapidlly worsening course since that time.  Prior procedures on the left hip include arthroscopy and ORIF with plate, screws and cables.  Patient currently rates pain in the left hip at 9 out of 10 with activity.  There is night pain, worsening of pain with activity and weight bearing, trendelenberg gait, pain that interfers with activities of daily living and pain with passive range of motion. Patient has evidence of the previous total hip arthroplasy as well as plate, screws and cables by imaging studies.  This condition presents safety issues increasing the risk of falls.  This patient has had proximal femur fracture and multiple dislocations.  There is no current active signs of infection.  Risks, benefits and expectations were discussed with the patient. Patient understand the risks, benefits and expectations and wishes to proceed with surgery.   Clinical Impression   Pt was admitted for L THR.  He has THPs and is PWB.  All education was completed.     OT Assessment  Patient does not need any further OT services    Follow Up Recommendations  No OT follow up    Barriers to  Discharge      Equipment Recommendations  None recommended by OT    Recommendations for Other Services    Frequency       Precautions / Restrictions Precautions Precautions: Posterior Hip Restrictions LLE Weight Bearing: Partial weight bearing LLE Partial Weight Bearing Percentage or Pounds: 50%   Pertinent Vitals/Pain No c/o pain    ADL  Eating/Feeding: +2 Total assistance Eating/Feeding: Patient Percentage: 80% Toilet Transfer: Simulated;Minimal assistance Toilet Transfer Method: Sit to stand Equipment Used: Sock aid;Reacher;Rolling walker Transfers/Ambulation Related to ADLs: +2 for safety with ambulation ADL Comments: Pt has had several hip surgeries but has not had one in 15 years.  He has a Sports administrator and sock aid, but feels he will have his wife help with adls.  Reviewed all precautions related to ADLs and pt verbalizes understanding.  He did not feel he needed to practice shower transfer:  reviewed sequence Pt does have a shower seat at home.  He states that it isn't really high:  Educated to make sure that it is at least as high as back crease of knee or higher (preferred) for THPs   OT Diagnosis:    OT Problem List:   OT Treatment Interventions:     OT Goals(Current goals can be found in the care plan section) Acute Rehab OT Goals Patient Stated Goal: I want to get this femur fixed and get on with my life.  Visit Information  Last OT Received On: 07/21/12 Assistance Needed: +1 History of Present Illness: Gregory Norton, 62 y.o. male, has a history of pain and functional disability in the left hip due to multiple  dislocations as well as multiple fractures and patient has failed non-surgical conservative treatments for greater than 12 weeks to include NSAID's and/or analgesics, flexibility and strengthening excercises, supervised PT with diminished ADL's post treatment, use of assistive devices and activity modification. The indications for the revision total hip arthroplasty  are fracture or mechanical failure of one or more component.  Onset of symptoms was abrupt starting >10 years ago with rapidlly worsening course since that time.  Prior procedures on the left hip include arthroscopy and ORIF with plate, screws and cables.  Patient currently rates pain in the left hip at 9 out of 10 with activity.  There is night pain, worsening of pain with activity and weight bearing, trendelenberg gait, pain that interfers with activities of daily living and pain with passive range of motion. Patient has evidence of the previous total hip arthroplasy as well as plate, screws and cables by imaging studies.  This condition presents safety issues increasing the risk of falls.  This patient has had proximal femur fracture and multiple dislocations.  There is no current active signs of infection.  Risks, benefits and expectations were discussed with the patient. Patient understand the risks, benefits and expectations and wishes to proceed with surgery.       Prior Functioning     Home Living Family/patient expects to be discharged to:: Private residence Living Arrangements: Spouse/significant other Available Help at Discharge: Family Type of Home: House Home Access: Stairs to enter [employer] of Steps: 1 Home Layout: One level Home Equipment: Crutches;Wheelchair - Economist - 2 wheels;Cane - single point Prior Function Level of Independence: Independent with assistive device(s) Communication Communication: No difficulties         Vision/Perception     Cognition  Cognition Arousal/Alertness: Awake/alert Behavior During Therapy: WFL for tasks assessed/performed Overall Cognitive Status: Within Functional Limits for tasks assessed    Extremity/Trunk Assessment Upper Extremity Assessment Upper Extremity Assessment: Overall WFL for tasks assessed Lower Extremity Assessment Lower Extremity Assessment: LLE deficits/detail LLE Deficits /  Details: pt was able to move Leg to edge of bed with min assistance     Mobility Bed Mobility Bed Mobility: Supine to Sit Supine to Sit: 4: Min assist;HOB elevated Details for Bed Mobility Assistance: cues for precautions of L hip. support of LLE  Transfers Sit to Stand: From bed;With upper extremity assist;From elevated surface;4: Min assist Stand to Sit: To chair/3-in-1;With armrests;With upper extremity assist;4: Min assist Details for Transfer Assistance: cues for hand and LLE position and precautions     Exercise     Balance     End of Session OT - End of Session Activity Tolerance: Patient tolerated treatment well Patient left: in chair;with call bell/phone within reach  GO     Jacquelynn Friend 07/21/2012, 1:30 PM Marica Otter, OTR/L [phone] 07/21/2012

## 2012-07-21 NOTE — Care Management Note (Signed)
    Page 1 of 1   07/21/2012     4:06:39 PM   CARE MANAGEMENT NOTE 07/21/2012  Patient:  Gregory Norton,Gregory Norton   Account Number:  [id number]  Date Initiated:  07/21/2012  Documentation initiated by:  Colleen Can  Subjective/Objective Assessment:   dx nonunion left periprosthetic femur fracture, instability of left hip arthroplasty; revision left total hip     Action/Plan:   CM spoke with patient. Plans are for patient to return to his home in [place] [place] [facility] [place] where spouse will provide care. He already has DME. Wishes to use in network provider for [facility].   Anticipated DC Date:  07/22/2012   Anticipated DC Plan:  HOME W HOME HEALTH SERVICES      DC Planning Services  CM consult      [facility] Choice  HOME HEALTH   Choice offered to / List presented to:  C-1 Patient        HH arranged  HH-2 PT      [facility] agency  [facility]   Status of service:  In process, will continue to follow Medicare Important Message given?   (If response is "NO", the following Medicare IM given date fields will be blank) Date Medicare IM given:   Date Additional Medicare IM given:    Discharge Disposition:    Per UR Regulation:    If discussed at Long Length of Stay Meetings, dates discussed:    Comments:  07/21/2012 Colleen Can BSN RN CCM [phone] Spoke with [facility] Rep who stated that they can provide [facility] services anf that they are in netwotk with pt's insurance. HH orders, face sheet, face to face, op note and H&P given to rep.

## 2012-07-22 ENCOUNTER — Encounter (HOSPITAL_COMMUNITY): Payer: Self-pay | Admitting: Orthopedic Surgery

## 2012-07-22 LAB — CBC
Hemoglobin: 8.5 g/dL — ABNORMAL LOW (ref 13.0–17.0)
MCH: 30 pg (ref 26.0–34.0)
Platelets: 209 10*3/uL (ref 150–400)
RBC: 2.83 MIL/uL — ABNORMAL LOW (ref 4.22–5.81)
WBC: 22.2 10*3/uL — ABNORMAL HIGH (ref 4.0–10.5)

## 2012-07-22 LAB — BASIC METABOLIC PANEL
CO2: 32 mEq/L (ref 19–32)
Calcium: 8.5 mg/dL (ref 8.4–10.5)
GFR calc non Af Amer: >90 mL/min (ref >90)
Potassium: 4.8 mEq/L (ref 3.5–5.1)
Sodium: 136 mEq/L (ref 135–145)

## 2012-07-22 MED ORDER — TRAMADOL HCL 50 MG PO TABS: 50.0000 mg | ORAL_TABLET | Freq: Four times a day (QID) | ORAL | Status: DC | PRN

## 2012-07-22 MED ORDER — TIZANIDINE HCL 4 MG PO CAPS: 4.0000 mg | ORAL_CAPSULE | Freq: Three times a day (TID) | ORAL | Status: DC | PRN

## 2012-07-22 NOTE — Progress Notes (Signed)
Pt to d/c home with [facility]. No DME needs. AVS reviewed and "My Chart" discussed with pt. Pt capable of verbalizing medications and follow-up appointments. Remains hemodynamically stable. No signs and symptoms of distress. Educated pt to return to ER in the case of SOB, dizziness, or chest pain.

## 2012-07-22 NOTE — Progress Notes (Addendum)
   Subjective: 2 Days Post-Op Procedure(s) (LRB): LEFT TOTAL HIP REVISION (Left)   Patient reports pain as mild, pain well controlled. No events throughout the night. Ready to be discharged home.   Objective:   VITALS:   Filed Vitals:   07/22/12  BP: 137/83  Pulse: 65  Temp: 98.3 F (36.8 C)   Resp: 16    Neurovascular intact Dorsiflexion/Plantar flexion intact Incision: dressing C/D/I No cellulitis present Compartment soft  LABS  Recent Labs  07/21/12 0451 07/22/12 0435  HGB 9.3* 8.5*  HCT 27.5* 24.4*  WBC 18.0* 22.2*  PLT 231 209     Recent Labs  07/21/12 0451 07/22/12 0435  NA 133* 136  K 4.6 4.8  BUN 18 16  CREATININE 0.89 0.77  GLUCOSE 159* 155*     Assessment/Plan: 2 Days Post-Op Procedure(s) (LRB): LEFT TOTAL HIP REVISION (Left) Has been doing well with Tramadol, changed home Rx from Norco to tramadol  Up with therapy Discharge home with home health Follow up in 2 weeks at [facility]. Follow up with OLIN,Iracema Lanagan D in 2 weeks.  Contact information:  [facility] [address], [address] [place] [address] [phone]    Expected ABLA  Treated with iron and will observe   Obese (BMI 30-39.9)  Estimated body mass index is 35.04 kg/(m^2) as calculated from the following:      Height as of this encounter: 6\' 2"  (1.88 m).      Weight as of this encounter: 123.832 kg (273 lb).  Patient also counseled that weight may inhibit the healing process  Patient counseled that losing weight will help with future health issues    Anastasio Auerbach. Voris Tigert   PAC  07/22/2012, 9:51 AM

## 2012-07-22 NOTE — Progress Notes (Signed)
Physical Therapy Treatment Patient Details Name: Gregory Norton MRN: [id number] DOB: [birth year] Today's Date: 07/22/2012 Time: 4098-1191 PT Time Calculation (min): 39 min  PT Assessment / Plan / Recommendation  PT Comments   Pt was dizzy first up, subsided. Pt ready for DC.  Follow Up Recommendations  Home health PT     Does the patient have the potential to tolerate intense rehabilitation     Barriers to Discharge        Equipment Recommendations  None recommended by PT    Recommendations for Other Services    Frequency     Progress towards PT Goals Progress towards PT goals: Progressing toward goals  Plan Current plan remains appropriate    Precautions / Restrictions Precautions Precautions: Posterior Hip Restrictions Weight Bearing Restrictions: Yes LLE Weight Bearing: Partial weight bearing LLE Partial Weight Bearing Percentage or Pounds: 50%   Pertinent Vitals/Pain none    Mobility  Bed Mobility Supine to Sit: 4: Min assist;HOB elevated Details for Bed Mobility Assistance: cues for precautions of L hip. support of LLE  Transfers Sit to Stand: With upper extremity assist;From elevated surface;From chair/3-in-1;From bed;5: Supervision Stand to Sit: With armrests;With upper extremity assist;To chair/3-in-1;5: Supervision Details for Transfer Assistance: cues for hand and LLE position and precautions Ambulation/Gait Ambulation/Gait Assistance: 4: Min guard Ambulation Distance (Feet): 20 Feet (then 180) Assistive device: Rolling walker Ambulation/Gait Assistance Details: pt c/o dizziness after amb/practice 1 step returned to room and sat in recliner, dizziness subsided. Gait Pattern: Step-through pattern;Decreased step length - left Stairs: Yes Stairs Assistance: 4: Min assist Stair Management Technique: No rails;Backwards;With walker Number of Stairs: 1    Exercises Total Joint Exercises Quad Sets: AROM;Left;10 reps Short Arc Quad: AROM;Left;10 reps Heel Slides:  AROM;Left;10 reps Hip ABduction/ADduction: AAROM;Left;10 reps Long Arc Quad: AROM;Left   PT Diagnosis:    PT Problem List:   PT Treatment Interventions:     PT Goals (current goals can now be found in the care plan section)    Visit Information  Last PT Received On: 07/22/12 Assistance Needed: +1    Subjective Data      Cognition  Cognition Arousal/Alertness: Awake/alert    Balance     End of Session PT - End of Session Activity Tolerance: Patient tolerated treatment well Patient left: in chair;with call bell/phone within reach Nurse Communication: Mobility status   GP     Rada Hay 07/22/2012, 11:01 AM

## 2012-07-23 NOTE — Discharge Summary (Signed)
Physician Discharge Summary  Patient ID: Gregory Norton MRN: [id number] DOB/AGE: 1950-02-01 62 y.o.  Admit date: 07/20/2012 Discharge date: 07/22/2012   Procedures:  Procedure(s) (LRB): LEFT TOTAL HIP REVISION (Left)  Attending Physician:  Dr. Durene Romans   Admission Diagnoses:   Left hip OA / pain  Discharge Diagnoses:  Principal Problem:   S/P left TH revision Active Problems:   Expectd blood loss anemia   Obese  Past Medical History  Diagnosis Date  . Hypertension   . GERD (gastroesophageal reflux disease)   . Osteoporosis   . Hypercholesteremia   . Sleep apnea     no CPAP  . Cancer 2014    prostate cancer  . Arthritis   . Complication of anesthesia     low O2 sats, could not wake up after hip surgery    HPI: Gregory Norton, 62 y.o. male, has a history of pain and functional disability in the left hip due to multiple dislocations as well as multiple fractures and patient has failed non-surgical conservative treatments for greater than 12 weeks to include NSAID's and/or analgesics, flexibility and strengthening excercises, supervised PT with diminished ADL's post treatment, use of assistive devices and activity modification. The indications for the revision total hip arthroplasty are fracture or mechanical failure of one or more component. Onset of symptoms was abrupt starting >10 years ago with rapidlly worsening course since that time. Prior procedures on the left hip include arthroscopy and ORIF with plate, screws and cables. Patient currently rates pain in the left hip at 9 out of 10 with activity. There is night pain, worsening of pain with activity and weight bearing, trendelenberg gait, pain that interfers with activities of daily living and pain with passive range of motion. Patient has evidence of the previous total hip arthroplasy as well as plate, screws and cables by imaging studies. This condition presents safety issues increasing the risk of falls. This patient has had  proximal femur fracture and multiple dislocations. There is no current active signs of infection. Risks, benefits and expectations were discussed with the patient. Patient understand the risks, benefits and expectations and wishes to proceed with surgery.  PCP: Angelica Chessman., MD   Discharged Condition: good  Hospital Course:  Patient underwent the above stated procedure on 07/20/2012. Patient tolerated the procedure well and brought to the recovery room in good condition and subsequently to the floor.  POD #1 BP: 121/79 ; Pulse: 78 ; Temp: 97.9 F (36.6 C) ; Resp: 16  Pt's foley was removed, as well as the hemovac drain removed. IV was changed to a saline lock. Patient reports pain as mild, pain well controlled. Little dizzy when he sat on the side of the bed, but otherwise no events. Neurovascular intact, dorsiflexion/plantar flexion intact, incision: dressing C/D/I, no cellulitis present and compartment soft.   LABS  Basename    HGB  9.3  HCT  27.5   POD #2  BP: 137/83 ; Pulse: 65 ; Temp: 98.3 F (36.8 C) ; Resp: 16 Patient reports pain as mild, pain well controlled. No events throughout the night. Ready to be discharged home.  Neurovascular intact, dorsiflexion/plantar flexion intact, incision: dressing C/D/I, no cellulitis present and compartment soft.   LABS  Basename    HGB  8.5  HCT  24.4    Discharge Exam: General appearance: alert, cooperative and no distress Extremities: Homans sign is negative, no sign of DVT, no edema, redness or tenderness in the calves or thighs and no ulcers,  gangrene or trophic changes  Disposition:    Home-Health Care Svc with follow up in 2 weeks   Follow-up Information   Follow up with Shelda Pal, MD. Schedule an appointment as soon as possible for a visit in 2 weeks.   Contact information:   [address] [place] [address] [phone]       Discharge Orders   Future Orders Complete By Expires     Call MD  / Call 911  As directed     Comments:      If you experience chest pain or shortness of breath, CALL 911 and be transported to the hospital emergency room.  If you develope a fever above 101 F, pus (white drainage) or increased drainage or redness at the wound, or calf pain, call your surgeon's office.    Change dressing  As directed     Comments:      Daily dressing changes with 4x4 guaze and tape. Keep the area dry and clean.    Constipation Prevention  As directed     Comments:      Drink plenty of fluids.  Prune juice may be helpful.  You may use a stool softener, such as Colace (over the counter) 100 mg twice a day.  Use MiraLax (over the counter) for constipation as needed.    Diet - low sodium heart healthy  As directed     Discharge instructions  As directed     Comments:      Daily dressing changes with 4x4 gauze and tape. Keep the area dry and clean until follow up. Follow up in 2 weeks at [facility]. Call with any questions or concerns.    Driving restrictions  As directed     Comments:      No driving for 4 weeks    Partial weight bearing  As directed     Comments:      50% on left leg    TED hose  As directed     Comments:      Use stockings (TED hose) for 2 weeks on both leg(s).  You may remove them at night for sleeping.         Medication List         aspirin 325 MG EC tablet  Take 1 tablet (325 mg total) by mouth 2 (two) times daily.     Calcium Lactate 750 MG Tabs  Take 750 mg by mouth daily.     diltiazem 300 MG 24 hr capsule  Commonly known as:  CARDIZEM CD  Take 300 mg by mouth every evening.     DSS 100 MG Caps  Take 100 mg by mouth 2 (two) times daily.     ferrous sulfate 325 (65 FE) MG tablet  Take 1 tablet (325 mg total) by mouth 3 (three) times daily after meals.     magnesium oxide 400 MG tablet  Commonly known as:  MAG-OX  Take 400 mg by mouth daily.     multivitamin with minerals Tabs  Take 1 tablet by mouth daily.      pantoprazole 40 MG tablet  Commonly known as:  PROTONIX  Take 40 mg by mouth every morning.     polyethylene glycol packet  Commonly known as:  MIRALAX / GLYCOLAX  Take 17 g by mouth 2 (two) times daily.     simvastatin 10 MG tablet  Commonly known as:  ZOCOR  Take 10 mg by  mouth every evening.     tiZANidine 4 MG capsule  Commonly known as:  ZANAFLEX  Take 1 capsule (4 mg total) by mouth 3 (three) times daily as needed for muscle spasms.     traMADol 50 MG tablet  Commonly known as:  ULTRAM  Take 1-2 tablets (50-100 mg total) by mouth every 6 (six) hours as needed for pain.         Signed: Anastasio Auerbach. Agustus Mane   PAC  07/23/2012, 10:32 AM

## 2013-12-26 ENCOUNTER — Emergency Department (HOSPITAL_BASED_OUTPATIENT_CLINIC_OR_DEPARTMENT_OTHER): Payer: Medicare Other

## 2013-12-26 ENCOUNTER — Encounter (HOSPITAL_BASED_OUTPATIENT_CLINIC_OR_DEPARTMENT_OTHER): Payer: Self-pay

## 2013-12-26 ENCOUNTER — Emergency Department (HOSPITAL_BASED_OUTPATIENT_CLINIC_OR_DEPARTMENT_OTHER)
Admission: EM | Admit: 2013-12-26 | Discharge: 2013-12-26 | Disposition: A | Discharge status: Home / Self Care | Payer: Medicare Other | Source: Home / Self Care | Attending: Emergency Medicine | Admitting: Emergency Medicine

## 2013-12-26 DIAGNOSIS — G4730 Sleep apnea, unspecified: Secondary | ICD-10-CM

## 2013-12-26 DIAGNOSIS — Y9289 Other specified places as the place of occurrence of the external cause: Secondary | ICD-10-CM

## 2013-12-26 DIAGNOSIS — E780 Pure hypercholesterolemia: Secondary | ICD-10-CM

## 2013-12-26 DIAGNOSIS — Y9389 Activity, other specified: Secondary | ICD-10-CM | POA: Insufficient documentation

## 2013-12-26 DIAGNOSIS — K219 Gastro-esophageal reflux disease without esophagitis: Secondary | ICD-10-CM

## 2013-12-26 DIAGNOSIS — T84091A Other mechanical complication of internal left hip prosthesis, initial encounter: Secondary | ICD-10-CM | POA: Diagnosis not present

## 2013-12-26 DIAGNOSIS — W010XXA Fall on same level from slipping, tripping and stumbling without subsequent striking against object, initial encounter: Secondary | ICD-10-CM

## 2013-12-26 DIAGNOSIS — Z87891 Personal history of nicotine dependence: Secondary | ICD-10-CM

## 2013-12-26 DIAGNOSIS — I10 Essential (primary) hypertension: Secondary | ICD-10-CM

## 2013-12-26 DIAGNOSIS — S63502A Unspecified sprain of left wrist, initial encounter: Secondary | ICD-10-CM | POA: Insufficient documentation

## 2013-12-26 DIAGNOSIS — Z9981 Dependence on supplemental oxygen: Secondary | ICD-10-CM

## 2013-12-26 DIAGNOSIS — Y998 Other external cause status: Secondary | ICD-10-CM

## 2013-12-26 DIAGNOSIS — M1990 Unspecified osteoarthritis, unspecified site: Secondary | ICD-10-CM

## 2013-12-26 DIAGNOSIS — Z8546 Personal history of malignant neoplasm of prostate: Secondary | ICD-10-CM

## 2013-12-26 DIAGNOSIS — M180 Bilateral primary osteoarthritis of first carpometacarpal joints: Secondary | ICD-10-CM | POA: Insufficient documentation

## 2013-12-26 DIAGNOSIS — W19XXXA Unspecified fall, initial encounter: Secondary | ICD-10-CM

## 2013-12-26 DIAGNOSIS — Z79899 Other long term (current) drug therapy: Secondary | ICD-10-CM

## 2013-12-26 NOTE — ED Provider Notes (Signed)
CSN: [id number]     Arrival date & time 12/26/13  1007 History   First MD Initiated Contact with Patient 12/26/13 1030     Chief Complaint  Patient presents with  . Wrist Injury     HPI  Patient is evaluation of left wrist pain. [place] and [place]. He states he has a history of hip and knee problems and thus put his left hand out to break his fall. He has pain and swelling in his left wrists morning presents here.  Past Medical History  Diagnosis Date  . Hypertension   . GERD (gastroesophageal reflux disease)   . Osteoporosis   . Hypercholesteremia   . Sleep apnea     no CPAP  . Cancer 2014    prostate cancer  . Arthritis   . Complication of anesthesia     low O2 sats, could not wake up after hip surgery   Past Surgical History  Procedure Laterality Date  . Hip open reduction Left   . Appendectomy    . Joint replacement    . Prostatectomy  March 2014  . Foot surgery Bilateral 10 years ago    6 surgeries after crushing both feet  . Shoulder surgery Right at 63 years old  . Hip surgery Right at 63 years old  . Total hip revision Left 07/20/2012    Procedure: LEFT TOTAL HIP REVISION;  Surgeon: [name], MD;  Location: [facility];  Service: Orthopedics;  Laterality: Left;   No family history on file. History  Substance Use Topics  . Smoking status: Former Smoker -- 15 years    Types: Cigarettes    Quit date: 01/14/1978  . Smokeless tobacco: Never Used  . Alcohol Use: Yes     Comment: social    Review of Systems  Musculoskeletal:       Left wrist pain and swelling.      Allergies  Review of patient's allergies indicates no known allergies.  Home Medications   Prior to Admission medications   Medication Sig Start Date End Date Taking? Authorizing Provider  Ascorbic Acid (VITAMIN C) 1000 MG tablet Take 1,000 mg by mouth daily.   Yes Historical Provider, MD  Calcium Lactate 750 MG TABS Take 750 mg by mouth daily.     Historical Provider, MD  diltiazem (CARDIZEM CD) 300 MG 24 hr capsule Take 300 mg by mouth every evening.     Historical Provider, MD  magnesium oxide (MAG-OX) 400 MG tablet Take 400 mg by mouth daily.    Historical Provider, MD  Multiple Vitamin (MULTIVITAMIN WITH MINERALS) TABS Take 1 tablet by mouth daily.    Historical Provider, MD  pantoprazole (PROTONIX) 40 MG tablet Take 40 mg by mouth every morning.     Historical Provider, MD  polyethylene glycol (MIRALAX / GLYCOLAX) packet Take 17 g by mouth 2 (two) times daily. 07/21/12   Lucille Passy Babish, PA-C  simvastatin (ZOCOR) 10 MG tablet Take 10 mg by mouth every evening.     Historical Provider, MD  tiZANidine (ZANAFLEX) 4 MG capsule Take 1 capsule (4 mg total) by mouth 3 (three) times daily as needed for muscle spasms. 07/22/12   Lucille Passy Babish, PA-C   BP 129/79 mmHg  Pulse 74  Temp(Src) 98.4 F (36.9 C) (Oral)  Resp 20  Ht 6\' 2"  (1.88 m)  Wt 280 lb (127.007 kg)  BMI 35.93 kg/m2  SpO2 98% Physical Exam  Musculoskeletal:  Arms: Tenderness in the snuffbox of the left wrist. Painful range of motion left wrist. Neurologically intact distally. Normal movement of the fingers. Non-tender and normal movement at the elbow.    ED Course  Procedures (including critical care time) Labs Review Labs Reviewed - No data to display  Imaging Review Dg Forearm Left  12/26/2013   CLINICAL DATA:  Fall yesterday w/ lt distal radius pain, Difficult for pt to bend elbow  EXAM: LEFT FOREARM - 2 VIEW  COMPARISON:  None.  FINDINGS: There is no evidence of fracture or other focal bone lesions. Corticated ossicle adjacent to the ulnar styloid. Soft tissues are unremarkable.  IMPRESSION: Negative.   Electronically Signed   By: Arne Cleveland M.D.   On: 12/26/2013 10:43   Dg Wrist Complete Left  12/26/2013   CLINICAL DATA:  [place] yesterday. Braced himself with the left wrist. Pain near the base of the thumb today with swelling.  EXAM: LEFT WRIST -  COMPLETE 3+ VIEW  COMPARISON:  12/26/2013 forearm  FINDINGS: There is mild degenerative change in the wrist. Suspect old ulnar styloid process fracture. No acute fracture or dislocation. No radiopaque foreign body or soft tissue gas.  IMPRESSION: No evidence for acute  abnormality.   Electronically Signed   By: Shon Hale M.D.   On: 12/26/2013 11:22     EKG Interpretation None      MDM   Final diagnoses:  Wrist sprain, left, initial encounter  Sprain of wrist, left, initial encounter    X-rays of the wrist, and forearm read as normal by radiology showing no acute fracture. With his snuff box tenderness have asked that he be placed into a Velcro splint. I reviewed this with him. I have asked him to wear the splint until pain and swelling have resolved. I have discussed with him the possibility of an occult navicular fracture. Have encouraged him to seek follow-up with his primary care physician 7-10 days if he has continued pain.    Tanna Furry, MD 12/26/13 [phone]

## 2013-12-26 NOTE — ED Notes (Signed)
Patient reports that he [place] fell [place] airport [place] injurying left forearm/wrist. Reports that he tried to catch himself with the fall. No obvious deformity but pain with use.

## 2013-12-26 NOTE — Discharge Instructions (Signed)
° °  Wear the splint until the pain is swelling in your wrist have resolved. If it continues to be painful after 7-10 days recheck with your physician for additional x-ray. The navicular bone in the wrist can be fractured and not show up on initial x-rays, it is important to be seen again if not improving.  Sprain A sprain is a tear in one of the strong, fibrous tissues that connect your bones (ligaments). The severity of the sprain depends on how much of the ligament is torn. The tear can be either partial or complete. CAUSES  Often, sprains are a result of a fall or an injury. The force of the impact causes the fibers of your ligament to stretch beyond their normal length. This excess tension causes the fibers of your ligament to tear. SYMPTOMS  You may have some loss of motion or increased pain within your normal range of motion. Other symptoms include:  Bruising.  Tenderness.  Swelling. DIAGNOSIS  In order to diagnose a sprain, your caregiver will physically examine you to determine how torn the ligament is. Your caregiver may also suggest an X-ray exam to make sure no bones are broken. TREATMENT  If your ligament is only partially torn, treatment usually involves keeping the injured area in a fixed position (immobilization) for a short period. To do this, your caregiver will apply a bandage, cast, or splint to keep the area from moving until it heals. For a partially torn ligament, the healing process usually takes 2 to 3 weeks. If your ligament is completely torn, you may need surgery to reconnect the ligament to the bone or to reconstruct the ligament. After surgery, a cast or splint may be applied and will need to stay on for 4 to 6 weeks while your ligament heals. HOME CARE INSTRUCTIONS  Keep the injured area elevated to decrease swelling.  To ease pain and swelling, apply ice to your joint twice a day, for 2 to 3 days.  Put ice in a plastic bag.  Place a towel between your skin  and the bag.  Leave the ice on for 15 minutes.  Only take over-the-counter or prescription medicine for pain as directed by your caregiver.  Do not leave the injured area unprotected until pain and stiffness go away (usually 3 to 4 weeks).  Do not allow your cast or splint to get wet. Cover your cast or splint with a plastic bag when you shower or bathe. Do not swim.  Your caregiver may suggest exercises for you to do during your recovery to prevent or limit permanent stiffness. SEEK IMMEDIATE MEDICAL CARE IF:  Your cast or splint becomes damaged.  Your pain becomes worse. MAKE SURE YOU:  Understand these instructions.  Will watch your condition.  Will get help right away if you are not doing well or get worse. Document Released: 12/29/1999 Document Revised: 03/25/2011 Document Reviewed: 01/12/2011 [facility] Patient Information 2015 [place], [place]. This information is not intended to replace advice given to you by your health care provider. Make sure you discuss any questions you have with your health care provider.

## 2013-12-28 ENCOUNTER — Encounter (HOSPITAL_COMMUNITY): Payer: Self-pay | Admitting: Emergency Medicine

## 2013-12-28 ENCOUNTER — Emergency Department (HOSPITAL_COMMUNITY)
Admission: EM | Admit: 2013-12-28 | Discharge: 2013-12-28 | Disposition: A | Discharge status: Home / Self Care | Payer: Medicare Other | Source: Home / Self Care | Attending: Emergency Medicine | Admitting: Emergency Medicine

## 2013-12-28 ENCOUNTER — Emergency Department (HOSPITAL_COMMUNITY): Payer: Medicare Other

## 2013-12-28 DIAGNOSIS — Y9289 Other specified places as the place of occurrence of the external cause: Secondary | ICD-10-CM | POA: Insufficient documentation

## 2013-12-28 DIAGNOSIS — Z9889 Other specified postprocedural states: Secondary | ICD-10-CM | POA: Insufficient documentation

## 2013-12-28 DIAGNOSIS — I10 Essential (primary) hypertension: Secondary | ICD-10-CM | POA: Diagnosis present

## 2013-12-28 DIAGNOSIS — Z791 Long term (current) use of non-steroidal anti-inflammatories (NSAID): Secondary | ICD-10-CM | POA: Insufficient documentation

## 2013-12-28 DIAGNOSIS — E780 Pure hypercholesterolemia: Secondary | ICD-10-CM

## 2013-12-28 DIAGNOSIS — Y9389 Activity, other specified: Secondary | ICD-10-CM

## 2013-12-28 DIAGNOSIS — Z9079 Acquired absence of other genital organ(s): Secondary | ICD-10-CM | POA: Diagnosis present

## 2013-12-28 DIAGNOSIS — M810 Age-related osteoporosis without current pathological fracture: Secondary | ICD-10-CM | POA: Insufficient documentation

## 2013-12-28 DIAGNOSIS — E669 Obesity, unspecified: Secondary | ICD-10-CM | POA: Diagnosis present

## 2013-12-28 DIAGNOSIS — K219 Gastro-esophageal reflux disease without esophagitis: Secondary | ICD-10-CM

## 2013-12-28 DIAGNOSIS — M1990 Unspecified osteoarthritis, unspecified site: Secondary | ICD-10-CM | POA: Diagnosis present

## 2013-12-28 DIAGNOSIS — Z96642 Presence of left artificial hip joint: Secondary | ICD-10-CM | POA: Diagnosis present

## 2013-12-28 DIAGNOSIS — Z87891 Personal history of nicotine dependence: Secondary | ICD-10-CM

## 2013-12-28 DIAGNOSIS — T84091A Other mechanical complication of internal left hip prosthesis, initial encounter: Principal | ICD-10-CM | POA: Diagnosis present

## 2013-12-28 DIAGNOSIS — Z6835 Body mass index (BMI) 35.0-35.9, adult: Secondary | ICD-10-CM

## 2013-12-28 DIAGNOSIS — W19XXXA Unspecified fall, initial encounter: Secondary | ICD-10-CM

## 2013-12-28 DIAGNOSIS — S79912A Unspecified injury of left hip, initial encounter: Secondary | ICD-10-CM | POA: Insufficient documentation

## 2013-12-28 DIAGNOSIS — G4730 Sleep apnea, unspecified: Secondary | ICD-10-CM | POA: Insufficient documentation

## 2013-12-28 DIAGNOSIS — Z79899 Other long term (current) drug therapy: Secondary | ICD-10-CM | POA: Insufficient documentation

## 2013-12-28 DIAGNOSIS — Y998 Other external cause status: Secondary | ICD-10-CM | POA: Insufficient documentation

## 2013-12-28 DIAGNOSIS — M25552 Pain in left hip: Secondary | ICD-10-CM

## 2013-12-28 DIAGNOSIS — Z8546 Personal history of malignant neoplasm of prostate: Secondary | ICD-10-CM | POA: Insufficient documentation

## 2013-12-28 NOTE — ED Provider Notes (Signed)
Pt signed out by Baron Sane, PA-C at shift change.  Pt is a 63yo male with hx of multiple hip surgeries, presenting to ED with c/o left hip pain, 2 days s/p mechanical fall.  Pt c/o feeling very unstable in his left hip.  While in ED, pt found to have decreased ROM left hip.   CT shows: left hip- femoral head os posteriorly and laterally subluxed w/o frank dislocation, may be due in part to wear of lining of acetabular cup.  4:27 PM Plan from [place] is to consult with orthopedics, pt is seen by Dr. Alvan Dame and has f/u already scheduled for tomorrow.  Will consult to ensure pt can be discharged home today with knee immobilizer and crutches.   5:01 PM Consulted with Dr. Alvan Dame, pt may be discharged home to f/u as scheduled for tomorrow.  Pt declined knee immobilizer and crutches stating that he has crutches and a wheelchair at home and stated "i dont think i need anything else to make it to the appointment tomorrow."    Pt discharged home in stable condition.    Noland Fordyce, PA-C 12/28/13 1717  Pamella Pert, MD 12/30/13 1250

## 2013-12-28 NOTE — ED Notes (Signed)
MD at bedside. EDPA PRESENT 

## 2013-12-28 NOTE — ED Notes (Signed)
Bed: WA01 Expected date:  Expected time:  Means of arrival:  Comments: 

## 2013-12-28 NOTE — ED Notes (Signed)
Patient transported to CT 

## 2013-12-28 NOTE — Progress Notes (Signed)
CSW met with patient at bedside. Wife was at bedside. Per note, patient is presenting to the ED for left hip pain. Patient states that he is still feeling the same pain but is relieved that the doctor has found the problem. Patient confirmed that he lives at home with his wife.   Wife/ Bayard Hugger [phone]  Willette Brace [phone] ED CSW 12/28/2013 4:54 PM

## 2013-12-28 NOTE — ED Provider Notes (Signed)
CSN: [id number]     Arrival date & time 12/28/13  1128 History   First MD Initiated Contact with Patient 12/28/13 1202     Chief Complaint  Patient presents with  . Hip Pain    Left, fell last weekend     (Consider location/radiation/quality/duration/timing/severity/associated sxs/prior Treatment) HPI Comments: Patient is a 63 yo M PMHx significant for HTN, GERD, HLD, s/p multiple hip surgeries presenting to the ED for left hip pain. Patient states after he fell two days ago he has felt very unstable on his left hip. He states he feels "it is sliding in the socket without complete dislocation." He states he only has pain when his hip "slides around." No further falls since four days ago. He is scheduled to see Dr. Alvan Dame tomorrow, but is worried his hip will dislocate in the mean time. No other complaints.   Patient is a 63 y.o. male presenting with hip pain.  Hip Pain Associated symptoms include arthralgias and myalgias.    Past Medical History  Diagnosis Date  . Hypertension   . GERD (gastroesophageal reflux disease)   . Osteoporosis   . Hypercholesteremia   . Sleep apnea     no CPAP  . Cancer 2014    prostate cancer  . Arthritis   . Complication of anesthesia     low O2 sats, could not wake up after hip surgery   Past Surgical History  Procedure Laterality Date  . Hip open reduction Left   . Appendectomy    . Joint replacement    . Prostatectomy  March 2014  . Foot surgery Bilateral 10 years ago    6 surgeries after crushing both feet  . Shoulder surgery Right at 63 years old  . Hip surgery Right at 63 years old  . Total hip revision Left 07/20/2012    Procedure: LEFT TOTAL HIP REVISION;  Surgeon: [name], MD;  Location: [facility];  Service: Orthopedics;  Laterality: Left;   No family history on file. History  Substance Use Topics  . Smoking status: Former Smoker -- 15 years    Types: Cigarettes    Quit date: 01/14/1978  . Smokeless tobacco: Never Used  .  Alcohol Use: Yes     Comment: social    Review of Systems  Musculoskeletal: Positive for myalgias and arthralgias.  All other systems reviewed and are negative.     Allergies  Review of patient's allergies indicates no known allergies.  Home Medications   Prior to Admission medications   Medication Sig Start Date End Date Taking? Authorizing Provider  Ascorbic Acid (VITAMIN C) 1000 MG tablet Take 1,000 mg by mouth every evening.    Yes Historical Provider, MD  calcium carbonate (TUMS - DOSED IN MG ELEMENTAL CALCIUM) 500 MG chewable tablet Chew 1 tablet by mouth every evening.   Yes Historical Provider, MD  diclofenac sodium (VOLTAREN) 1 % GEL Apply 2 g topically 4 (four) times daily as needed (pain).   Yes Historical Provider, MD  diltiazem (CARDIZEM CD) 300 MG 24 hr capsule Take 300 mg by mouth every evening.    Yes Historical Provider, MD  magnesium oxide (MAG-OX) 400 MG tablet Take 400 mg by mouth every evening.    Yes Historical Provider, MD  Multiple Vitamin (MULTIVITAMIN WITH MINERALS) TABS Take 1 tablet by mouth every evening.    Yes Historical Provider, MD  pantoprazole (PROTONIX) 40 MG tablet Take 40 mg by mouth every morning.    Yes Historical  Provider, MD  simvastatin (ZOCOR) 10 MG tablet Take 10 mg by mouth every evening.    Yes Historical Provider, MD  Calcium Lactate 750 MG TABS Take 750 mg by mouth every evening.     Historical Provider, MD  polyethylene glycol (MIRALAX / GLYCOLAX) packet Take 17 g by mouth 2 (two) times daily. Patient not taking: Reported on 12/28/2013 07/21/12   Lucille Passy Babish, PA-C  tiZANidine (ZANAFLEX) 4 MG capsule Take 1 capsule (4 mg total) by mouth 3 (three) times daily as needed for muscle spasms. Patient not taking: Reported on 12/28/2013 07/22/12   Lucille Passy Babish, PA-C   BP 139/84 mmHg  Pulse 66  Temp(Src) 98 F (36.7 C) (Oral)  Resp 14  Wt 280 lb (127.007 kg)  SpO2 97% Physical Exam  Constitutional: He is oriented to person,  place, and time. He appears well-developed and well-nourished. No distress.  HENT:  Head: Normocephalic and atraumatic.  Right Ear: External ear normal.  Left Ear: External ear normal.  Nose: Nose normal.  Mouth/Throat: Oropharynx is clear and moist.  Eyes: Conjunctivae are normal.  Neck: Normal range of motion. Neck supple.  Cardiovascular: Normal rate, regular rhythm, normal heart sounds and intact distal pulses.   Pulmonary/Chest: Effort normal and breath sounds normal.  Abdominal: Soft.  Musculoskeletal:       Right hip: Normal.       Left hip: He exhibits decreased range of motion and tenderness. He exhibits normal strength and no deformity.       Left knee: Normal.       Left ankle: Normal.       Left upper leg: Normal.       Left lower leg: Normal.       Left foot: Normal.  Neurological: He is alert and oriented to person, place, and time.  Skin: Skin is warm and dry. He is not diaphoretic.  Psychiatric: He has a normal mood and affect.  Nursing note and vitals reviewed.   ED Course  Procedures (including critical care time) Medications - No data to display  Labs Review Labs Reviewed - No data to display  Imaging Review Dg Hip Complete Left  12/28/2013   CLINICAL DATA:  Recent slip and fall with hip pain  EXAM: LEFT HIP - COMPLETE 2+ VIEW  COMPARISON:  07/20/2012  FINDINGS: A left hip replacement is again identified. The overall appearance is stable. Some increase callus is noted along the distal aspect of the femoral prosthesis. No dislocation is noted.  IMPRESSION: Left hip replacement without acute abnormality.   Electronically Signed   By: Inez Catalina M.D.   On: 12/28/2013 13:12   Ct Hip Left Wo Contrast  12/28/2013   CLINICAL DATA:  Status post fall 12/25/2013 with left hip pain.  EXAM: CT OF THE LEFT HIP WITHOUT CONTRAST  TECHNIQUE: Multidetector CT imaging of the left hip was performed according to the standard protocol. Multiplanar CT image reconstructions were  also generated.  COMPARISON:  Plain films left hip 07/20/2012.  FINDINGS: A right total hip arthroplasty is in place with a long femoral stem component with cerclage wires proximally. There is no acute fracture. The patient has a remote proximal femur fracture. The femoral head is subluxed laterally out of the acetabular cup with approximately 50% uncovering of the femoral head. The femoral head is also posteriorly subluxed. This may be due in part to wear of the acetabular lining. Two screws are seen the posterior acetabulum possibly for prior bone  grafting. Imaged musculature appears normal. No focal abnormality of imaged musculature is identified. Atherosclerotic vascular disease is noted. The patient has a fat containing right inguinal hernia.  IMPRESSION: Right total hip arthroplasty. The femoral head is posteriorly and laterally subluxed without frank dislocation. This may be due in part to wear of the lining of the acetabular cup.  Negative for acute fracture. Remote proximal femur fracture with cerclage wires in place is noted.   Electronically Signed   By: Inge Rise M.D.   On: 12/28/2013 14:59     EKG Interpretation None      Patient declines any pain medication at this time.   MDM   Final diagnoses:  Left hip pain   Filed Vitals:   12/28/13 1541  BP: 139/84  Pulse: 66  Temp:   Resp: 14   Neurovascularly intact. Normal sensation. No evidence of compartment syndrome.  I have reviewed nursing notes, vital signs, and all appropriate lab and imaging results for this patient.  Awaiting consultation from [facility] regarding appropriate follow up, i.e. Whether can provide crutches and knee immobilizers vs. Admission. Pain free currently.   Harlow Mares, PA-C 12/28/13 1614  Charlesetta Shanks, MD 12/31/13 2106

## 2013-12-29 ENCOUNTER — Encounter (HOSPITAL_COMMUNITY): Payer: Self-pay | Admitting: *Deleted

## 2013-12-29 ENCOUNTER — Inpatient Hospital Stay (HOSPITAL_COMMUNITY)
Admission: AD | Admit: 2013-12-29 | Discharge: 2014-01-03 | DRG: 468 | Disposition: A | Discharge status: Home Health | Payer: Medicare Other | Source: Ambulatory Visit | Attending: Orthopedic Surgery | Admitting: Orthopedic Surgery

## 2013-12-29 DIAGNOSIS — K219 Gastro-esophageal reflux disease without esophagitis: Secondary | ICD-10-CM | POA: Diagnosis present

## 2013-12-29 DIAGNOSIS — Z96642 Presence of left artificial hip joint: Secondary | ICD-10-CM

## 2013-12-29 DIAGNOSIS — Z96649 Presence of unspecified artificial hip joint: Secondary | ICD-10-CM

## 2013-12-29 DIAGNOSIS — I10 Essential (primary) hypertension: Secondary | ICD-10-CM | POA: Diagnosis present

## 2013-12-29 DIAGNOSIS — Z9079 Acquired absence of other genital organ(s): Secondary | ICD-10-CM | POA: Diagnosis present

## 2013-12-29 DIAGNOSIS — M1990 Unspecified osteoarthritis, unspecified site: Secondary | ICD-10-CM | POA: Diagnosis present

## 2013-12-29 DIAGNOSIS — E669 Obesity, unspecified: Secondary | ICD-10-CM | POA: Diagnosis present

## 2013-12-29 DIAGNOSIS — Z6835 Body mass index (BMI) 35.0-35.9, adult: Secondary | ICD-10-CM | POA: Diagnosis not present

## 2013-12-29 DIAGNOSIS — Z87891 Personal history of nicotine dependence: Secondary | ICD-10-CM | POA: Diagnosis not present

## 2013-12-29 DIAGNOSIS — T84018A Broken internal joint prosthesis, other site, initial encounter: Secondary | ICD-10-CM

## 2013-12-29 DIAGNOSIS — M810 Age-related osteoporosis without current pathological fracture: Secondary | ICD-10-CM | POA: Diagnosis present

## 2013-12-29 DIAGNOSIS — E780 Pure hypercholesterolemia: Secondary | ICD-10-CM | POA: Diagnosis present

## 2013-12-29 DIAGNOSIS — G4730 Sleep apnea, unspecified: Secondary | ICD-10-CM | POA: Diagnosis present

## 2013-12-29 DIAGNOSIS — T84091A Other mechanical complication of internal left hip prosthesis, initial encounter: Secondary | ICD-10-CM | POA: Diagnosis present

## 2013-12-29 DIAGNOSIS — T84018D Broken internal joint prosthesis, other site, subsequent encounter: Secondary | ICD-10-CM

## 2013-12-29 LAB — BASIC METABOLIC PANEL
Anion gap: 12 (ref 5–15)
BUN: 20 mg/dL (ref 6–23)
CALCIUM: 9.2 mg/dL (ref 8.4–10.5)
CO2: 26 mEq/L (ref 19–32)
Chloride: 100 mEq/L (ref 96–112)
Creatinine, Ser: 0.88 mg/dL (ref 0.50–1.35)
GFR calc Af Amer: >90 mL/min (ref >90)
GFR, EST NON AFRICAN AMERICAN: 88 mL/min — AB (ref >90)
Glucose, Bld: 85 mg/dL (ref 70–99)
Potassium: 4.3 mEq/L (ref 3.7–5.3)
SODIUM: 138 meq/L (ref 137–147)

## 2013-12-29 LAB — CBC
HCT: 45 % (ref 39.0–52.0)
Hemoglobin: 15.1 g/dL (ref 13.0–17.0)
MCH: 31.7 pg (ref 26.0–34.0)
MCHC: 33.6 g/dL (ref 30.0–36.0)
MCV: 94.3 fL (ref 78.0–100.0)
PLATELETS: 272 10*3/uL (ref 150–400)
RBC: 4.77 MIL/uL (ref 4.22–5.81)
RDW: 13.2 % (ref 11.5–15.5)
WBC: 11.2 10*3/uL — AB (ref 4.0–10.5)

## 2013-12-29 MED ORDER — POLYETHYLENE GLYCOL 3350 17 G PO PACK: 17.0000 g | PACK | Freq: Every day | ORAL | Status: DC | PRN

## 2013-12-29 MED ORDER — SODIUM CHLORIDE 0.9 % IV SOLN
INTRAVENOUS | Status: DC
  Administered 2013-12-29 – 2013-12-31 (×3): via INTRAVENOUS

## 2013-12-29 MED ORDER — CALCIUM CARBONATE ANTACID 500 MG PO CHEW
1.0000 | CHEWABLE_TABLET | Freq: Every evening | ORAL | Status: DC
  Administered 2013-12-29 – 2014-01-02 (×5): 200 mg via ORAL
  Filled 2013-12-29 (×8): qty 1

## 2013-12-29 MED ORDER — DOCUSATE SODIUM 100 MG PO CAPS
100.0000 mg | ORAL_CAPSULE | Freq: Two times a day (BID) | ORAL | Status: DC
  Administered 2013-12-29 – 2013-12-31 (×4): 100 mg via ORAL

## 2013-12-29 MED ORDER — DILTIAZEM HCL ER COATED BEADS 300 MG PO CP24
300.0000 mg | ORAL_CAPSULE | Freq: Every day | ORAL | Status: DC
  Administered 2013-12-29 – 2014-01-02 (×5): 300 mg via ORAL
  Filled 2013-12-29 (×7): qty 1

## 2013-12-29 MED ORDER — HYDROCODONE-ACETAMINOPHEN 5-325 MG PO TABS
1.0000 | ORAL_TABLET | Freq: Four times a day (QID) | ORAL | Status: DC | PRN
  Administered 2013-12-29: 1 via ORAL
  Filled 2013-12-29: qty 1

## 2013-12-29 MED ORDER — SIMVASTATIN 10 MG PO TABS
10.0000 mg | ORAL_TABLET | Freq: Every day | ORAL | Status: DC
  Administered 2013-12-29 – 2014-01-02 (×5): 10 mg via ORAL
  Filled 2013-12-29 (×7): qty 1

## 2013-12-29 MED ORDER — PANTOPRAZOLE SODIUM 40 MG PO TBEC
40.0000 mg | DELAYED_RELEASE_TABLET | Freq: Every morning | ORAL | Status: DC
  Administered 2013-12-30 – 2014-01-03 (×3): 40 mg via ORAL
  Filled 2013-12-29 (×5): qty 1

## 2013-12-29 MED ORDER — ONDANSETRON HCL 4 MG/2ML IJ SOLN: 4.0000 mg | Freq: Four times a day (QID) | INTRAMUSCULAR | Status: DC | PRN

## 2013-12-29 MED ORDER — ONDANSETRON HCL 4 MG PO TABS: 4.0000 mg | ORAL_TABLET | Freq: Four times a day (QID) | ORAL | Status: DC | PRN

## 2013-12-29 MED ORDER — MAGNESIUM CITRATE PO SOLN: 1.0000 | Freq: Once | ORAL | Status: AC | PRN

## 2013-12-29 MED ORDER — BISACODYL 10 MG RE SUPP: 10.0000 mg | Freq: Every day | RECTAL | Status: DC | PRN

## 2013-12-29 MED ORDER — ENOXAPARIN SODIUM 40 MG/0.4ML ~~LOC~~ SOLN
40.0000 mg | Freq: Once | SUBCUTANEOUS | Status: AC
  Administered 2013-12-30: 40 mg via SUBCUTANEOUS
  Filled 2013-12-29: qty 0.4

## 2013-12-29 MED ORDER — ALUM & MAG HYDROXIDE-SIMETH 200-200-20 MG/5ML PO SUSP: 30.0000 mL | Freq: Four times a day (QID) | ORAL | Status: DC | PRN

## 2013-12-30 ENCOUNTER — Encounter (HOSPITAL_COMMUNITY): Payer: Self-pay | Admitting: Anesthesiology

## 2013-12-30 LAB — SURGICAL PCR SCREEN
MRSA, PCR: NEGATIVE
Staphylococcus aureus: POSITIVE — AB

## 2013-12-30 MED ORDER — MUPIROCIN 2 % EX OINT
TOPICAL_OINTMENT | Freq: Two times a day (BID) | CUTANEOUS | Status: DC
  Administered 2013-12-30 – 2013-12-31 (×3): via NASAL
  Administered 2014-01-01: 1 via NASAL
  Administered 2014-01-02 – 2014-01-03 (×3): via NASAL
  Filled 2013-12-30: qty 22

## 2013-12-30 NOTE — Progress Notes (Signed)
Utilization review completed.  

## 2013-12-30 NOTE — H&P (Signed)
TOTAL HIP REVISION ADMISSION H&P  Patient is admitted for left revision total hip arthroplasty.  Subjective:  Chief Complaint:     Left hip pain and instability s/p fall  HPI: Gregory Norton, 63 y.o. male,  s/p multiple hip surgeries presenting to the ED for left hip pain. Patient states after he fell four days ago he has felt very unstable on his left hip. He states he feels "it is sliding in the socket without complete dislocation." He states he only has pain when his hip "slides around." No further falls since the initial fall.  He present to [facility] an saw Dr. Alvan Dame with continued pain in the left hip. Dr. Alvan Dame and the patient discussed the fall, his symptoms including increasing pain and instability. Alvan Dame believe that the cemented cup has most likely loosened and causing the feelings of instability. He is admitted to the hospital with plans for Dr. Alvan Dame to do a revision of the left hip on Friday, 12/18/215.  Risks, benefits and expectations were discussed with the patient.  Risks including but not limited to the risk of anesthesia, blood clots, nerve damage, blood vessel damage, failure of the prosthesis, infection and up to and including death.  Patient understand the risks, benefits and expectations and wishes to proceed with surgery.     Patient Active Problem List   Diagnosis Date Noted  . 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 Expectd blood loss anemia 07/21/2012  . Obese 07/21/2012  . S/P left TH revision 07/20/2012   Past Medical History  Diagnosis Date  . Hypertension   . GERD (gastroesophageal reflux disease)   . Osteoporosis   . Hypercholesteremia   . Sleep apnea     no CPAP  . Cancer 2014    prostate cancer  . Arthritis   . Complication of anesthesia     low O2 sats, could not wake up after hip surgery    Past Surgical History  Procedure Laterality Date  . Hip open reduction Left   . Appendectomy    . Joint replacement    . Prostatectomy  March  2014  . Foot surgery Bilateral 10 years ago    6 surgeries after crushing both feet  . Shoulder surgery Right at 63 years old  . Hip surgery Right at 63 years old  . Total hip revision Left 07/20/2012    Procedure: LEFT TOTAL HIP REVISION;  Surgeon: [name], MD;  Location: [facility];  Service: Orthopedics;  Laterality: Left;    Prescriptions prior to admission  Medication Sig Dispense Refill Last Dose  . Ascorbic Acid (VITAMIN C) 1000 MG tablet Take 1,000 mg by mouth every evening.    12/28/2013 at Unknown time  . calcium carbonate (TUMS - DOSED IN MG ELEMENTAL CALCIUM) 500 MG chewable tablet Chew 1 tablet by mouth every evening.   12/28/2013 at Unknown time  . diltiazem (CARDIZEM CD) 300 MG 24 hr capsule Take 300 mg by mouth every evening.    12/28/2013 at Unknown time  . magnesium oxide (MAG-OX) 400 MG tablet Take 400 mg by mouth every evening.    12/28/2013 at Unknown time  . Multiple Vitamin (MULTIVITAMIN WITH MINERALS) TABS Take 1 tablet by mouth every evening.    12/28/2013 at Unknown time  . pantoprazole (PROTONIX) 40 MG tablet Take 40 mg by mouth every morning.    12/29/2013 at Unknown time  . simvastatin (ZOCOR) 10 MG tablet Take 10 mg by mouth every evening.  12/28/2013 at Unknown time  . diclofenac sodium (VOLTAREN) 1 % GEL Apply 2 g topically 4 (four) times daily as needed (pain).   unknown at unknown time  . tiZANidine (ZANAFLEX) 4 MG capsule Take 1 capsule (4 mg total) by mouth 3 (three) times daily as needed for muscle spasms. 50 capsule 0 unknown at unknown time   No Known Allergies   History  Substance Use Topics  . Smoking status: Former Smoker -- 15 years    Types: Cigarettes    Quit date: 01/14/1978  . Smokeless tobacco: Never Used  . Alcohol Use: Yes     Comment: social        Review of Systems  Constitutional: Negative.   HENT: Negative.   Eyes: Negative.   Respiratory: Negative.   Cardiovascular: Negative.   Gastrointestinal: Positive for heartburn.   Musculoskeletal: Positive for joint pain.  Skin: Negative.   Neurological: Negative.   Endo/Heme/Allergies: Negative.   Psychiatric/Behavioral: Negative.     Objective:  Physical Exam  Constitutional: He is oriented to person, place, and time. He appears well-developed and well-nourished.  HENT:  Head: Normocephalic and atraumatic.  Eyes: Pupils are equal, round, and reactive to light.  Neck: Neck supple. No JVD present. No tracheal deviation present. No thyromegaly present.  Cardiovascular: Normal rate, regular rhythm, normal heart sounds and intact distal pulses.   Respiratory: Effort normal and breath sounds normal. No respiratory distress. He has no wheezes.  GI: Soft. There is no tenderness. There is no guarding.  Musculoskeletal:       Left hip: He exhibits decreased range of motion, decreased strength, tenderness, bony tenderness, swelling and laceration (healed previous incision). He exhibits no crepitus and no deformity.  Lymphadenopathy:    He has no cervical adenopathy.  Neurological: He is alert and oriented to person, place, and time.  Skin: Skin is warm and dry.  Psychiatric: He has a normal mood and affect.    Vital signs in last 24 hours: Temp:  [97.7 F (36.5 C)-98.4 F (36.9 C)] 97.9 F (36.6 C) (12/17 0524) Pulse Rate:  [62-67] 63 (12/17 0524) Resp:  [16] 16 (12/17 0524) BP: (129-140)/(75-81) 129/77 mmHg (12/17 0524) SpO2:  [96 %-98 %] 98 % (12/17 0524) Weight:  [127 kg (279 lb 15.8 oz)] 127 kg (279 lb 15.8 oz) (12/16 1738)   Labs:   Estimated body mass index is 35.93 kg/(m^2) as calculated from the following:   Height as of this encounter: 6\' 2"  (1.88 m).   Weight as of this encounter: 127 kg (279 lb 15.8 oz).  Imaging Review:  Plain radiographs demonstrate previous hip surgery of the left hip(s). There is evidence of loosening of the acetabular cup.The bone quality appears to be good for age and reported activity level.    Assessment/Plan:  Left hip with failed previous arthroplasty.  The patient history, physical examination, clinical judgement of the provider and imaging studies are consistent with instability of the left hip(s), previous total hip arthroplasty. Revision total hip arthroplasty is deemed medically necessary. The treatment options including medical management, injection therapy, arthroscopy and arthroplasty were discussed at length. The risks and benefits of total hip arthroplasty were presented and reviewed. The risks due to aseptic loosening, infection, stiffness, dislocation/subluxation,  thromboembolic complications and other imponderables were discussed.  The patient acknowledged the explanation, agreed to proceed with the plan and consent was signed. Patient is being admitted for inpatient treatment for surgery, pain control, PT, OT, prophylactic antibiotics, VTE prophylaxis, progressive ambulation  and ADL's and discharge planning. The patient is planning to be discharged home with home health services.    [place] Milania Haubner   PA-C  12/30/2013, 10:09 AM

## 2013-12-31 ENCOUNTER — Encounter (HOSPITAL_COMMUNITY)
Admission: AD | Disposition: A | Discharge status: Home Health | Payer: Self-pay | Source: Ambulatory Visit | Attending: Orthopedic Surgery

## 2013-12-31 ENCOUNTER — Inpatient Hospital Stay (HOSPITAL_COMMUNITY): Admission: RE | Admit: 2013-12-31 | Payer: Medicare Other | Source: Ambulatory Visit | Admitting: Orthopedic Surgery

## 2013-12-31 SURGERY — REVISION, TOTAL ARTHROPLASTY, HIP, ACETABULAR COMPONENT
Anesthesia: Spinal | Site: Hip | Laterality: Left

## 2013-12-31 NOTE — Care Management Note (Signed)
    Page 1 of 1   12/31/2013     2:53:38 PM CARE MANAGEMENT NOTE 12/31/2013  Patient:  Gregory Norton,Gregory Norton   Account Number:  [id number]  Date Initiated:  12/31/2013  Documentation initiated by:  Dessa Phi  Subjective/Objective Assessment:   63 y/o m admitted w/failed l total hip arthroplasty     Action/Plan:   From home.   Anticipated DC Date:  01/01/2014   Anticipated DC Plan:  [place]  CM consult      Choice offered to / List presented to:             Status of service:  In process, will continue to follow Medicare Important Message given?  YES (If response is "NO", the following Medicare IM given date fields will be blank) Date Medicare IM given:  12/31/2013 Medicare IM given by:  [facility] Date Additional Medicare IM given:   Additional Medicare IM given by:    Discharge Disposition:    Per UR Regulation:  Reviewed for med. necessity/level of care/duration of stay  If discussed at [place] of Stay Meetings, dates discussed:    Comments:  12/31/13 Dessa Phi RN BSN NCM [phone] for sx today.await post sx recommendations.

## 2013-12-31 NOTE — Anesthesia Preprocedure Evaluation (Addendum)
Anesthesia Evaluation  Patient identified by MRN, date of birth, ID band Patient awake    Reviewed: Allergy & Precautions, H&P , NPO status , Patient's Chart, lab work & pertinent test results  History of Anesthesia Complications (+) history of anesthetic complications (low O2 sats and slow to emerge after an anesthetic)  Airway Mallampati: II  TM Distance: >3 FB Neck ROM: Full    Dental no notable dental hx. (+) Dental Advisory Given, Poor Dentition   Pulmonary sleep apnea and Continuous Positive Airway Pressure Ventilation , former smoker,  breath sounds clear to auscultation  Pulmonary exam normal       Cardiovascular hypertension, Pt. on medications Rhythm:Regular Rate:Normal     Neuro/Psych negative neurological ROS  negative psych ROS   GI/Hepatic Neg liver ROS, GERD-  Medicated and Controlled,  Endo/Other  obesity  Renal/GU negative Renal ROS  negative genitourinary   Musculoskeletal negative musculoskeletal ROS (+) Arthritis -, Osteoarthritis,    Abdominal   Peds negative pediatric ROS (+)  Hematology negative hematology ROS (+)   Anesthesia Other Findings   Reproductive/Obstetrics negative OB ROS                            Anesthesia Physical Anesthesia Plan  ASA: III  Anesthesia Plan: General   Post-op Pain Management:    Induction: Intravenous  Airway Management Planned: Oral ETT  Additional Equipment:   Intra-op Plan:   Post-operative Plan: Extubation in OR  Informed Consent: I have reviewed the patients History and Physical, chart, labs and discussed the procedure including the risks, benefits and alternatives for the proposed anesthesia with the patient or authorized representative who has indicated his/her understanding and acceptance.   Dental advisory given  Plan Discussed with: CRNA  Anesthesia Plan Comments:         Anesthesia Quick  Evaluation

## 2014-01-01 ENCOUNTER — Encounter (HOSPITAL_COMMUNITY)
Admission: AD | Disposition: A | Discharge status: Home Health | Payer: Self-pay | Source: Ambulatory Visit | Attending: Orthopedic Surgery

## 2014-01-01 ENCOUNTER — Inpatient Hospital Stay (HOSPITAL_COMMUNITY): Payer: Medicare Other

## 2014-01-01 ENCOUNTER — Inpatient Hospital Stay (HOSPITAL_COMMUNITY): Payer: Medicare Other | Admitting: Anesthesiology

## 2014-01-01 HISTORY — PX: ACETABULAR REVISION: SHX5712

## 2014-01-01 LAB — TYPE AND SCREEN
ABO/RH(D): A POS
Antibody Screen: NEGATIVE

## 2014-01-01 SURGERY — REVISION, TOTAL ARTHROPLASTY, HIP, ACETABULAR COMPONENT
Anesthesia: General | Laterality: Left

## 2014-01-01 MED ORDER — PHENYLEPHRINE HCL 10 MG/ML IJ SOLN
10.0000 mg | INTRAMUSCULAR | Status: DC | PRN
  Administered 2014-01-01: 50 ug/min via INTRAVENOUS

## 2014-01-01 MED ORDER — GLYCOPYRROLATE 0.2 MG/ML IJ SOLN
INTRAMUSCULAR | Status: DC | PRN
  Administered 2014-01-01: .8 mg via INTRAVENOUS

## 2014-01-01 MED ORDER — METOCLOPRAMIDE HCL 5 MG/ML IJ SOLN: 5.0000 mg | Freq: Three times a day (TID) | INTRAMUSCULAR | Status: DC | PRN

## 2014-01-01 MED ORDER — CEFAZOLIN SODIUM-DEXTROSE 2-3 GM-% IV SOLR
2.0000 g | Freq: Four times a day (QID) | INTRAVENOUS | Status: AC
  Administered 2014-01-01 (×2): 2 g via INTRAVENOUS
  Filled 2014-01-01 (×2): qty 50

## 2014-01-01 MED ORDER — FERROUS SULFATE 325 (65 FE) MG PO TABS
325.0000 mg | ORAL_TABLET | Freq: Two times a day (BID) | ORAL | Status: DC
  Administered 2014-01-02 – 2014-01-03 (×3): 325 mg via ORAL
  Filled 2014-01-01 (×6): qty 1

## 2014-01-01 MED ORDER — LIDOCAINE HCL (CARDIAC) 20 MG/ML IV SOLN
INTRAVENOUS | Status: DC | PRN
  Administered 2014-01-01: 100 mg via INTRAVENOUS

## 2014-01-01 MED ORDER — PHENYLEPHRINE HCL 10 MG/ML IJ SOLN
INTRAMUSCULAR | Status: DC | PRN
  Administered 2014-01-01 (×4): 80 ug via INTRAVENOUS

## 2014-01-01 MED ORDER — NEOSTIGMINE METHYLSULFATE 10 MG/10ML IV SOLN
INTRAVENOUS | Status: DC | PRN
Start: 2014-01-01 — End: 2014-01-01
  Administered 2014-01-01: 5 mg via INTRAVENOUS

## 2014-01-01 MED ORDER — SODIUM CHLORIDE 0.9 % IJ SOLN
INTRAMUSCULAR | Status: AC
  Filled 2014-01-01: qty 20

## 2014-01-01 MED ORDER — SODIUM CHLORIDE 0.9 % IV SOLN
INTRAVENOUS | Status: DC
  Administered 2014-01-01 – 2014-01-02 (×2): via INTRAVENOUS
  Filled 2014-01-01 (×7): qty 1000

## 2014-01-01 MED ORDER — LACTATED RINGERS IV SOLN
INTRAVENOUS | Status: DC | PRN
  Administered 2014-01-01 (×2): via INTRAVENOUS

## 2014-01-01 MED ORDER — SODIUM CHLORIDE 0.9 % IV SOLN
1000.0000 mg | INTRAVENOUS | Status: DC
  Filled 2014-01-01: qty 10

## 2014-01-01 MED ORDER — HYDROMORPHONE HCL 1 MG/ML IJ SOLN
0.2500 mg | INTRAMUSCULAR | Status: DC | PRN
  Administered 2014-01-01 (×4): 0.5 mg via INTRAVENOUS

## 2014-01-01 MED ORDER — ASPIRIN EC 325 MG PO TBEC
325.0000 mg | DELAYED_RELEASE_TABLET | Freq: Two times a day (BID) | ORAL | Status: DC
  Administered 2014-01-01 – 2014-01-03 (×4): 325 mg via ORAL
  Filled 2014-01-01 (×6): qty 1

## 2014-01-01 MED ORDER — ACETAMINOPHEN 325 MG PO TABS
650.0000 mg | ORAL_TABLET | Freq: Four times a day (QID) | ORAL | Status: DC | PRN
Start: 2014-01-01 — End: 2014-01-03

## 2014-01-01 MED ORDER — DOCUSATE SODIUM 100 MG PO CAPS
100.0000 mg | ORAL_CAPSULE | Freq: Two times a day (BID) | ORAL | Status: DC
  Administered 2014-01-01 – 2014-01-03 (×4): 100 mg via ORAL

## 2014-01-01 MED ORDER — PHENOL 1.4 % MT LIQD
1.0000 | OROMUCOSAL | Status: DC | PRN
  Filled 2014-01-01: qty 177

## 2014-01-01 MED ORDER — SODIUM CHLORIDE 0.9 % IR SOLN
Status: DC | PRN
  Administered 2014-01-01: 1000 mL

## 2014-01-01 MED ORDER — EPHEDRINE SULFATE 50 MG/ML IJ SOLN
INTRAMUSCULAR | Status: DC | PRN
  Administered 2014-01-01: 5 mg via INTRAVENOUS

## 2014-01-01 MED ORDER — HYDROMORPHONE HCL 1 MG/ML IJ SOLN
0.5000 mg | INTRAMUSCULAR | Status: DC | PRN
  Administered 2014-01-01: 1 mg via INTRAVENOUS
  Filled 2014-01-01: qty 1

## 2014-01-01 MED ORDER — SUCCINYLCHOLINE CHLORIDE 20 MG/ML IJ SOLN
INTRAMUSCULAR | Status: DC | PRN
  Administered 2014-01-01: 100 mg via INTRAVENOUS

## 2014-01-01 MED ORDER — KETOROLAC TROMETHAMINE 30 MG/ML IJ SOLN
INTRAMUSCULAR | Status: AC
  Filled 2014-01-01: qty 1

## 2014-01-01 MED ORDER — POLYETHYLENE GLYCOL 3350 17 G PO PACK: 17.0000 g | PACK | Freq: Every day | ORAL | Status: DC | PRN

## 2014-01-01 MED ORDER — HYDROCODONE-ACETAMINOPHEN 7.5-325 MG PO TABS
1.0000 | ORAL_TABLET | Freq: Four times a day (QID) | ORAL | Status: DC
  Administered 2014-01-01 (×3): 2 via ORAL
  Administered 2014-01-02 – 2014-01-03 (×4): 1 via ORAL
  Filled 2014-01-01 (×2): qty 2
  Filled 2014-01-01: qty 1
  Filled 2014-01-01 (×2): qty 2
  Filled 2014-01-01 (×2): qty 1

## 2014-01-01 MED ORDER — ACETAMINOPHEN 650 MG RE SUPP: 650.0000 mg | Freq: Four times a day (QID) | RECTAL | Status: DC | PRN

## 2014-01-01 MED ORDER — METOCLOPRAMIDE HCL 10 MG PO TABS: 5.0000 mg | ORAL_TABLET | Freq: Three times a day (TID) | ORAL | Status: DC | PRN

## 2014-01-01 MED ORDER — ALUM & MAG HYDROXIDE-SIMETH 200-200-20 MG/5ML PO SUSP
30.0000 mL | ORAL | Status: DC | PRN
  Administered 2014-01-01 – 2014-01-02 (×2): 30 mL via ORAL
  Filled 2014-01-01 (×2): qty 30

## 2014-01-01 MED ORDER — ONDANSETRON HCL 4 MG PO TABS: 4.0000 mg | ORAL_TABLET | Freq: Four times a day (QID) | ORAL | Status: DC | PRN

## 2014-01-01 MED ORDER — FENTANYL CITRATE 0.05 MG/ML IJ SOLN
INTRAMUSCULAR | Status: DC | PRN
  Administered 2014-01-01: 50 ug via INTRAVENOUS
  Administered 2014-01-01: 100 ug via INTRAVENOUS

## 2014-01-01 MED ORDER — CEFAZOLIN SODIUM-DEXTROSE 2-3 GM-% IV SOLR
INTRAVENOUS | Status: DC | PRN
  Administered 2014-01-01: 2 g via INTRAVENOUS

## 2014-01-01 MED ORDER — TRANEXAMIC ACID 100 MG/ML IV SOLN
1000.0000 mg | INTRAVENOUS | Status: DC | PRN
  Administered 2014-01-01: 1000 mg via INTRAVENOUS

## 2014-01-01 MED ORDER — ONDANSETRON HCL 4 MG/2ML IJ SOLN: 4.0000 mg | Freq: Once | INTRAMUSCULAR | Status: DC | PRN

## 2014-01-01 MED ORDER — MIDAZOLAM HCL 5 MG/5ML IJ SOLN
INTRAMUSCULAR | Status: DC | PRN
  Administered 2014-01-01: 2 mg via INTRAVENOUS

## 2014-01-01 MED ORDER — ROCURONIUM BROMIDE 100 MG/10ML IV SOLN
INTRAVENOUS | Status: DC | PRN
  Administered 2014-01-01: 20 mg via INTRAVENOUS
  Administered 2014-01-01: 50 mg via INTRAVENOUS

## 2014-01-01 MED ORDER — ONDANSETRON HCL 4 MG/2ML IJ SOLN: 4.0000 mg | Freq: Four times a day (QID) | INTRAMUSCULAR | Status: DC | PRN

## 2014-01-01 MED ORDER — BUPIVACAINE-EPINEPHRINE 0.25% -1:200000 IJ SOLN
INTRAMUSCULAR | Status: AC
  Filled 2014-01-01: qty 1

## 2014-01-01 MED ORDER — PROPOFOL 10 MG/ML IV BOLUS
INTRAVENOUS | Status: DC | PRN
  Administered 2014-01-01: 200 mg via INTRAVENOUS

## 2014-01-01 MED ORDER — ONDANSETRON HCL 4 MG/2ML IJ SOLN
INTRAMUSCULAR | Status: DC | PRN
  Administered 2014-01-01: 4 mg via INTRAVENOUS

## 2014-01-01 MED ORDER — MENTHOL 3 MG MT LOZG
1.0000 | LOZENGE | OROMUCOSAL | Status: DC | PRN
  Filled 2014-01-01: qty 9

## 2014-01-01 SURGICAL SUPPLY — 58 items
ADH SKN CLS APL DERMABOND .7 (GAUZE/BANDAGES/DRESSINGS) ×1
BAG SPEC THK2 15X12 ZIP CLS (MISCELLANEOUS) ×1
BAG ZIPLOCK 12X15 (MISCELLANEOUS) ×3 IMPLANT
BLADE SAW SGTL 18X1.27X75 (BLADE) ×1 IMPLANT
BLADE SAW SGTL 18X1.27X75MM (BLADE) ×1
BRUSH FEMORAL CANAL (MISCELLANEOUS) IMPLANT
CEMENT HV SMART SET (Cement) ×2 IMPLANT
DERMABOND ADVANCED (GAUZE/BANDAGES/DRESSINGS) ×2
DERMABOND ADVANCED .7 DNX12 (GAUZE/BANDAGES/DRESSINGS) IMPLANT
DRAPE INCISE IOBAN 85X60 (DRAPES) ×3 IMPLANT
DRAPE ORTHO SPLIT 77X108 STRL (DRAPES) ×6
DRAPE POUCH INSTRU U-SHP 10X18 (DRAPES) ×3 IMPLANT
DRAPE SURG 17X11 SM STRL (DRAPES) ×3 IMPLANT
DRAPE SURG ORHT 6 SPLT 77X108 (DRAPES) ×2 IMPLANT
DRAPE U-SHAPE 47X51 STRL (DRAPES) ×3 IMPLANT
DRSG AQUACEL AG ADV 3.5X10 (GAUZE/BANDAGES/DRESSINGS) ×1 IMPLANT
DRSG AQUACEL AG ADV 3.5X14 (GAUZE/BANDAGES/DRESSINGS) ×3 IMPLANT
DRSG EMULSION OIL 3X16 NADH (GAUZE/BANDAGES/DRESSINGS) ×1 IMPLANT
DURAPREP 26ML APPLICATOR (WOUND CARE) ×5 IMPLANT
ELECT BLADE TIP CTD 4 INCH (ELECTRODE) ×3 IMPLANT
ELECT REM PT RETURN 9FT ADLT (ELECTROSURGICAL) ×3
ELECTRODE REM PT RTRN 9FT ADLT (ELECTROSURGICAL) ×1 IMPLANT
EVACUATOR 1/8 PVC DRAIN (DRAIN) IMPLANT
FACESHIELD WRAPAROUND (MASK) ×12 IMPLANT
FACESHIELD WRAPAROUND OR TEAM (MASK) ×4 IMPLANT
GLOVE BIOGEL PI IND STRL 7.5 (GLOVE) ×1 IMPLANT
GLOVE BIOGEL PI IND STRL 8.5 (GLOVE) ×1 IMPLANT
GLOVE BIOGEL PI INDICATOR 7.5 (GLOVE) ×2
GLOVE BIOGEL PI INDICATOR 8.5 (GLOVE) ×2
GLOVE ORTHO TXT STRL SZ7.5 (GLOVE) ×6 IMPLANT
GLOVE SURG ORTHO 8.0 STRL STRW (GLOVE) ×3 IMPLANT
GOWN SPEC L3 XXLG W/TWL (GOWN DISPOSABLE) ×3 IMPLANT
GOWN STRL REUS W/TWL LRG LVL3 (GOWN DISPOSABLE) ×3 IMPLANT
HANDPIECE INTERPULSE COAX TIP (DISPOSABLE) ×3
HEAD M SROM 36MM 2 (Hips) IMPLANT
IMMOBILIZER KNEE 22 (SOFTGOODS) ×2 IMPLANT
KIT BASIN OR (CUSTOM PROCEDURE TRAY) ×3 IMPLANT
LINER NEUTRAL 52X36X52 PLUS 4 (Liner) ×2 IMPLANT
LIQUID BAND (GAUZE/BANDAGES/DRESSINGS) ×3 IMPLANT
MANIFOLD NEPTUNE II (INSTRUMENTS) ×3 IMPLANT
NS IRRIG 1000ML POUR BTL (IV SOLUTION) ×6 IMPLANT
PACK TOTAL JOINT (CUSTOM PROCEDURE TRAY) ×3 IMPLANT
POSITIONER SURGICAL ARM (MISCELLANEOUS) ×3 IMPLANT
PRESSURIZER FEMORAL UNIV (MISCELLANEOUS) IMPLANT
SET HNDPC FAN SPRY TIP SCT (DISPOSABLE) ×1 IMPLANT
SPONGE LAP 18X18 X RAY DECT (DISPOSABLE) ×3 IMPLANT
SPONGE LAP 4X18 X RAY DECT (DISPOSABLE) IMPLANT
SROM M HEAD 36MM 2 (Hips) ×3 IMPLANT
STAPLER VISISTAT 35W (STAPLE) ×3 IMPLANT
SUCTION FRAZIER TIP 10 FR DISP (SUCTIONS) ×3 IMPLANT
SUT VIC AB 1 CT1 36 (SUTURE) ×9 IMPLANT
SUT VIC AB 2-0 CT1 27 (SUTURE) ×12
SUT VIC AB 2-0 CT1 TAPERPNT 27 (SUTURE) ×3 IMPLANT
TOWEL OR 17X26 10 PK STRL BLUE (TOWEL DISPOSABLE) ×6 IMPLANT
TOWEL OR NON WOVEN STRL DISP B (DISPOSABLE) ×3 IMPLANT
TOWER CARTRIDGE SMART MIX (DISPOSABLE) ×2 IMPLANT
TRAY FOLEY CATH 14FRSI W/METER (CATHETERS) ×3 IMPLANT
WATER STERILE IRR 1500ML POUR (IV SOLUTION) ×3 IMPLANT

## 2014-01-01 NOTE — Brief Op Note (Signed)
12/29/2013 - 01/01/2014  9:57 AM  PATIENT:  Lenise Arena  63 y.o. male  PRE-OPERATIVE DIAGNOSIS:  Failed left total hip replacement  POST-OPERATIVE DIAGNOSIS:   Failed left total hip replacement following revision left total hip replacement (see indications)  PROCEDURE:  Procedure(s): ACETABULAR REVISION (Left)  SURGEON:  Surgeon(s) and Role:    * [name], MD - Primary  PHYSICIAN ASSISTANT: Danae Orleans, PA-C  ANESTHESIA:   general  EBL:  Total I/O In: 0  Out: 300 [Urine:100; Blood:200]  BLOOD ADMINISTERED:none  DRAINS: none   LOCAL MEDICATIONS USED:  NONE  SPECIMEN:  No Specimen  DISPOSITION OF SPECIMEN:  N/A  COUNTS:  YES  TOURNIQUET:  * No tourniquets in log *  DICTATION: .Other Dictation: Dictation Number [id number]  PLAN OF CARE: Admit to inpatient   PATIENT DISPOSITION:  PACU - hemodynamically stable.   Delay start of Pharmacological VTE agent (>24hrs) due to surgical blood loss or risk of bleeding: no

## 2014-01-01 NOTE — Progress Notes (Signed)
Patient ID: Gregory Norton, male   DOB: Nov 17, 1950, 63 y.o.   MRN: [id number]  Case cancelled yesterday due to OR timing and placed on for this am Ready for improved pain relief and stability  Consent signed on chart NPO  To OR this am for revision left THR

## 2014-01-01 NOTE — Anesthesia Postprocedure Evaluation (Signed)
  Anesthesia Post-op Note  Patient: Gregory Norton  Procedure(s) Performed: Procedure(s) (LRB): ACETABULAR REVISION (Left)  Patient Location: PACU  Anesthesia Type: General  Level of Consciousness: awake and alert   Airway and Oxygen Therapy: Patient Spontanous Breathing  Post-op Pain: mild  Post-op Assessment: Post-op Vital signs reviewed, Patient's Cardiovascular Status Stable, Respiratory Function Stable, Patent Airway and No signs of Nausea or vomiting  Last Vitals:  Filed Vitals:   01/01/14 0640  BP: 121/84  Pulse: 56  Temp: 36.9 C  Resp: 18    Post-op Vital Signs: stable   Complications: No apparent anesthesia complications

## 2014-01-01 NOTE — Transfer of Care (Signed)
Immediate Anesthesia Transfer of Care Note  Patient: Gregory Norton  Procedure(s) Performed: Procedure(s): ACETABULAR REVISION (Left)  Patient Location: PACU  Anesthesia Type:General  Level of Consciousness: awake, alert  and oriented  Airway & Oxygen Therapy: Patient Spontanous Breathing and Patient connected to face mask oxygen  Post-op Assessment: Report given to PACU RN and Post -op Vital signs reviewed and stable  Post vital signs: Reviewed and stable  Complications: No apparent anesthesia complications

## 2014-01-01 NOTE — Plan of Care (Signed)
Problem: Consults Goal: Diagnosis- Total Joint Replacement Revision left total hip

## 2014-01-01 NOTE — Op Note (Signed)
NAMEJEFF, Gregory Norton                 ACCOUNT NO.:  [id number]  MEDICAL RECORD NO.:  [id number]  LOCATION:  3474                         FACILITY:  [facility]  PHYSICIAN:  Pietro Cassis. Alvan Dame, M.D.  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 See indications.  PROCEDURE:  Revision of left total hip arthroplasty.  COMPONENTS UTILIZED:  A size 52, 36+ 4, 10 degree face changing liner cemented into place with a 36+ 15.5, femoral head ball.  SURGEON:  Pietro Cassis. Alvan Dame, MD  ASSISTANT:  Danae Orleans, PA-C  ANESTHESIA:  General.  SPECIMENS:  None.  COMPLICATIONS:  None.  DRAINS:  None.  BLOOD LOSS:  200 mL.  Please note, physician assistant, Danae Orleans, was present for the entirety of the case, and preoperative position, perioperative management, operative extremity, facilitation of the case, and primary wound closure.  INDICATION FOR PROCEDURE:  Gregory Norton is a 63 year old male who had been a patient of mine for a while.  He had a failed left femoral component from previous total hip arthroplasty performed at [facility].  In July 2014, he went back to the operating room and we performed a revision left total hip arthroplasty of the femoral side requiring osteotomy.  I then maintained his previous acetabular cup based on the difficulties and challenges of his index, total hip arthroplasty including the use of acetabular allograft.  I was worried about bony ingrowth and decided to do a cemented liner in place.  His previously placed acetabular shell had grown into bone probably at least 6 degrees of abduction and only about 5 degrees of forward flexion.  I was able to place a liner into  position, more appropriate at that time.  After a short period of instability with this hip, he has gone on to be pain-free and functioning normally until this past week.  [place] the airport and unfortunately got tangled up in someone else's feet and luggage and fell directly on his left side.  He had immediate onset of pain in his left wrist, but also in his left hip area and since that time, reported significant concerns for instability.  He was seen and evaluated in the Urgent Care in the ER and subsequently in my office with findings concerning for failure of the acetabular liner.  We discussed the impact of the fall that very well could have fractured his acetabular liner.  There is the cement mantle around the liner, thus resulting in instability and dislodgement of the plastic from the cement.  Admitted into the hospital on the 16th, based on the instability and inability of him to be able to perform normal activities of daily living without concern particularly with his wife.  Risks, benefits, and necessity of the procedure were discussed and reviewed and he is ready to have it performed.  Standard risk of infection, DVT, component failure, dislocation were all reviewed and potential need  for future surgeries, are reviewed, discussed.  Consent was obtained.  PROCEDURE IN DETAIL:  The patient was brought to operative theater. Once adequate anesthesia preoperative antibiotics, Ancef administered as well as tranexamic acid, he was positioned into the right lateral decubitus position with the left side up.  The left lower extremity was then prepped and draped in sterile fashion.  A time-out was performed identifying the patient, planned procedure, and extremity.  Portion of his old incisions was utilized, excised his skin, soft tissue planes created.  I then performed a posterior approach to the hip through the iliotibial band and gluteal fascia.  The posterior aspect of the  hip was exposed, identifying fractured cement mantle which was removed.  I then dislocated the hip, which was very easy and removed the femoral head.  I then removed the acetabular liner without difficulty and then now spent time removing the cement that was placed into the acetabular shell previously.  This took some time and effort, we are using osteotomes.  Once the cement was all cleared from this, we opened up a new liner 52 x 36, +4, 10 degree face changing liner.  While the hip was being irrigated with normal saline solution, I used a high-speed bur to create a cross patch pattern along the backside of the acetabular liner to allow for further cement and better cement interdigitation. Once this was done, we mixed a batch of cement and when the cement became fairly moldable, I placed portion of the cement into the acetabular shell and then held it with direct pressure.  The new acetabular liner with a face changing portion at about 2 o'clock for this left hip and held in position at 35 to 40 degrees of abduction with significant improvement in the forward flexion until the cement fully cured.  Some minimal amount of cement was necessary to be removed.  Once this was done, I re-trialed.  We had previously used a 36+ 12 ball and even with that the ball, there was an episode early in his postop recovery where he had sense of instability.  After re-trialing with a 12, was selected and had available through [employer] at 36+ 15, 0.5 ball which I selected and opened, then impacted this on the clean and dry trunnion and reduced the hip.  In extension, there was no evidence of any subluxation.  With hip flexion, internal rotation, a little bit of movement within the acetabulum, but no evidence any subluxation throughout with significant range of motion.  I thus was satisfied with this.  We re-irrigated the hip at this point, I then reapproximated, allowed the soft tissues posterior to lap on  themselves with minimal suturing. I then reapproximated the iliotibial band and gluteal fascia using a combination of #1 Vicryl and 0 V-Loc sutures.  The remainder wound was closed with 2-0 Vicryl and then a running 3-0 Monocryl.  The hip was then cleaned, dried, and dressed sterilely using a surgical glue, and an Aquacel dressing.  Following this, he was awoken from anesthesia and brought to the recovery room in stable condition with a knee immobilizer.  Based on this history of the instability before, I am going to continue to follow him for probably 2 to 4 weeks with posterior hip precautions, limited weightbearing to allow for his muscles to heal, he can work on exercises including abduction, but with posterior hip precautions provided.     Pietro Cassis Alvan Dame, M.D.     MDO/MEDQ  D:  01/01/2014  T:  01/01/2014  Job:  [id number]

## 2014-01-02 LAB — CBC
HEMATOCRIT: 44.3 % (ref 39.0–52.0)
HEMOGLOBIN: 14.5 g/dL (ref 13.0–17.0)
MCH: 30.5 pg (ref 26.0–34.0)
MCHC: 32.7 g/dL (ref 30.0–36.0)
MCV: 93.3 fL (ref 78.0–100.0)
Platelets: 237 10*3/uL (ref 150–400)
RBC: 4.75 MIL/uL (ref 4.22–5.81)
RDW: 12.9 % (ref 11.5–15.5)
WBC: 11.8 10*3/uL — AB (ref 4.0–10.5)

## 2014-01-02 LAB — BASIC METABOLIC PANEL
Anion gap: 9 (ref 5–15)
BUN: 17 mg/dL (ref 6–23)
CHLORIDE: 96 meq/L (ref 96–112)
CO2: 32 meq/L (ref 19–32)
CREATININE: 0.93 mg/dL (ref 0.50–1.35)
Calcium: 8.9 mg/dL (ref 8.4–10.5)
GFR calc Af Amer: >90 mL/min (ref >90)
GFR calc non Af Amer: 86 mL/min — ABNORMAL LOW (ref >90)
GLUCOSE: 124 mg/dL — AB (ref 70–99)
POTASSIUM: 4.2 meq/L (ref 3.7–5.3)
Sodium: 137 mEq/L (ref 137–147)

## 2014-01-02 NOTE — Progress Notes (Signed)
Patient ID: Gregory Norton, male   DOB: [birth year], 63 y.o.   MRN: [id number] Subjective: 1 Day Post-Op Procedure(s) (LRB): ACETABULAR REVISION (Left)    Patient reports pain as mild.  Doing well this am, no major pain complaints  Objective:   VITALS:   Filed Vitals:   01/02/14 0628  BP: 122/65  Pulse: 62  Temp: 98.9 F (37.2 C)  Resp: 18    Neurovascular intact Incision: dressing C/D/I  LABS  Recent Labs  01/02/14 0536  HGB 14.5  HCT 44.3  WBC 11.8*  PLT 237     Recent Labs  01/02/14 0536  NA 137  K 4.2  BUN 17  CREATININE 0.93  GLUCOSE 124*    No results for input(s): LABPT, INR in the last 72 hours.   Assessment/Plan: 1 Day Post-Op Procedure(s) (LRB): ACETABULAR REVISION (Left)   Advance diet Up with therapy Plan for discharge tomorrow   From therapy standpoint reinforce posterior hip precautions (I reviewed this am the reason for knee immobilizer) Abduction exercises permitted PWB to allow soft tissues to heal   Probably home tomorrow with HHPT

## 2014-01-02 NOTE — Care Management (Signed)
CARE MANAGEMENT NOTE 01/02/2014  Patient:  Gregory Norton,Gregory Norton   Account Number:  [id number]  Date Initiated:  12/31/2013  Documentation initiated by:  Dessa Phi  Subjective/Objective Assessment:   63 y/o m admitted w/failed l total hip arthroplasty     Action/Plan:   From home.   Anticipated DC Date:  01/01/2014   Anticipated DC Plan:  [place]  CM consult      [facility] Choice  HOME HEALTH   Choice offered to / List presented to:  C-1 Patient        [place] arranged  [place] [facility]      [place].   Status of service:  Completed, signed off Medicare Important Message given?  YES (If response is "NO", the following Medicare IM given date fields will be blank) Date Medicare IM given:  12/31/2013 Medicare IM given by:  [facility] Date Additional Medicare IM given:   Additional Medicare IM given by:    Discharge Disposition:  [place]  Per UR Regulation:  Reviewed for med. necessity/level of care/duration of stay  If discussed at [place] of Stay Meetings, dates discussed:    Comments:  01/02/14 - Spoke with patient. Has 2 walkers, a shower chair and grab bars at home. Toilet seats are high. Has used [facility] in the past. Would like to use [facility] at discharge. No issues with medications. [facility] notified of pending discharge for 01/03/14. Venita Sheffield RN BSN CCM [phone]  12/31/13 Dessa Phi RN BSN NCM [phone] for sx today.await post sx recommendations.

## 2014-01-02 NOTE — Evaluation (Signed)
Occupational Therapy Evaluation Patient Details Name: Gregory Norton MRN: [id number] DOB: 05/02/1950 Today's Date: 01/02/2014    History of Present Illness s/p acetabular revision L hip PMHx: L THA, THA revision, HTN   Clinical Impression   Patient evaluated by Occupational Therapy with no further acute OT needs identified. All education has been completed and the patient has no further questions.Pt is very familiar with and is independent with posterior THA precautions.  He has all DME at home and is able to verbalize/demonstrate safe methods for BADLs.  His wife is very supportive.  No further OT needs identified.  See below for any follow-up Occupational Therapy or equipment needs. OT is signing off. Thank you for this referral.      Follow Up Recommendations  No OT follow up    Equipment Recommendations  None recommended by OT    Recommendations for Other Services       Precautions / Restrictions Precautions Precautions: Posterior Hip;Fall Precaution Comments: L KI left on for this session--orders unclear--pt states Dr. Alvan Dame wanted him to use it as a reminder Required Braces or Orthoses: Knee Immobilizer - Left Restrictions Weight Bearing Restrictions: Yes LLE Weight Bearing: Partial weight bearing LLE Partial Weight Bearing Percentage or Pounds: 50      Mobility Bed Mobility Overal bed mobility: Needs Assistance Bed Mobility: Supine to Sit     Supine to sit: +2 for safety/equipment;Min assist     General bed mobility comments: incr time, cues for technique, pt had much difficulty with anterior wt shift, getting balance on initial sitting  Transfers Overall transfer level: Needs assistance Equipment used: Rolling walker (2 wheeled) Transfers: Sit to/from Stand Sit to Stand: Min assist         General transfer comment: cues for  hadn placement and THP    Balance Overall balance assessment: Needs assistance           Standing balance-Leahy Scale: Poor                               ADL Overall ADL's : Needs assistance/impaired Eating/Feeding: Independent   Grooming: Wash/dry hands;Wash/dry face;Oral care;Brushing hair;Set up;Sitting   Upper Body Bathing: Set up;Sitting   Lower Body Bathing: Minimal assistance;Sit to/from stand;With adaptive equipment;Adhering to hip precautions   Upper Body Dressing : Set up;Sitting;With adaptive equipment   Lower Body Dressing: Minimal assistance;Adhering to hip precautions;With adaptive equipment;Sit to/from stand                 General ADL Comments: Pt is very familiar with posterior THA precautions.  He is able to demonstrate independence with them.  He is able to demonstrate safe and correct use of AE for LB ADLs, and reports his wife will also assist him as needed.  He is able to verbalize correct method for shower transfers as well as toilet transfers and doesn't feel he needs to practice these at this time.        Vision                     Perception     Praxis      Pertinent Vitals/Pain Pain Assessment: 0-10 Pain Score: 2  Pain Location: Lt hip Pain Descriptors / Indicators: Sore Pain Intervention(s): Monitored during session;RN gave pain meds during session     Hand Dominance Right   Extremity/Trunk Assessment Upper Extremity Assessment Upper Extremity Assessment: Overall WFL for tasks assessed  Lower Extremity Assessment Lower Extremity Assessment: Defer to PT evaluation LLE Deficits / Details: AAROM grossly WFL; strength at least 3/5 but limited by pain at hip intermittently       Communication Communication Communication: No difficulties   Cognition Arousal/Alertness: Awake/alert Behavior During Therapy: WFL for tasks assessed/performed Overall Cognitive Status: Within Functional Limits for tasks assessed                     General Comments       Exercises       Shoulder Instructions      Home Living Family/patient  expects to be discharged to:: Private residence Living Arrangements: Spouse/significant other Available Help at Discharge: Family;Available 24 hours/day Type of Home: House Home Access: Stairs to enter [employer] of Steps: 1   Home Layout: One level     Bathroom Shower/Tub: Occupational psychologist: Handicapped height     Home Equipment: Crutches;Walker - 2 wheels;Cane - single point;Wheelchair - manual;Shower seat;Grab bars - toilet;Grab bars - tub/shower;Adaptive equipment Adaptive Equipment: Reacher;Sock aid;Long-handled shoe horn;Long-handled sponge        Prior Functioning/Environment Level of Independence: Independent             OT Diagnosis: Generalized weakness;Acute pain   OT Problem List:     OT Treatment/Interventions:      OT Goals(Current goals can be found in the care plan section) Acute Rehab OT Goals Patient Stated Goal: I  OT Goal Formulation: All assessment and education complete, DC therapy  OT Frequency:     Barriers to D/C:            Co-evaluation              End of Session Equipment Utilized During Treatment: Other (comment) (AE) Nurse Communication: Mobility status  Activity Tolerance: Patient tolerated treatment well Patient left: in chair;with call bell/phone within reach;with nursing/sitter in room   Time: 1134-1159 OT Time Calculation (min): 25 min Charges:  OT General Charges $OT Visit: 1 Procedure OT Evaluation $Initial OT Evaluation Tier I: 1 Procedure OT Treatments $Self Care/Home Management : 8-22 mins G-Codes:    Weber Monnier M 12-Jan-2014, 12:41 PM

## 2014-01-02 NOTE — Evaluation (Signed)
Physical Therapy Evaluation Patient Details Name: Gregory Norton MRN: [id number] DOB: 11-16-1950 Today's Date: 01/02/2014   History of Present Illness  s/p acetabular revision L hip PMHx: L THA, THA revision, HTN  Clinical Impression  Pt admitted with above diagnosis. Pt currently with functional limitations due to the deficits listed below (see PT Problem List).  Pt will benefit from skilled PT to increase their independence and safety with mobility to allow discharge to the venue listed below.  Has DME, will neeed HHPT     Follow Up Recommendations Home health PT    Equipment Recommendations  None recommended by PT    Recommendations for Other Services       Precautions / Restrictions Precautions Precautions: Posterior Hip;Fall Precaution Comments: L KI left on for this session--orders unclear--pt states Dr. Alvan Dame wanted him to use it as a reminder Required Braces or Orthoses: Knee Immobilizer - Left Restrictions Weight Bearing Restrictions: Yes LLE Weight Bearing: Partial weight bearing LLE Partial Weight Bearing Percentage or Pounds: 50      Mobility  Bed Mobility Overal bed mobility: Needs Assistance Bed Mobility: Supine to Sit     Supine to sit: +2 for safety/equipment;Min assist     General bed mobility comments: incr time, cues for technique, pt had much difficulty with anterior wt shift, getting balance on initial sitting  Transfers Overall transfer level: Needs assistance Equipment used: Rolling walker (2 wheeled) Transfers: Sit to/from Stand Sit to Stand: Min assist         General transfer comment: cues for  hadn placement and THP  Ambulation/Gait Ambulation/Gait assistance: Min assist Ambulation Distance (Feet): 80 Feet Assistive device: Rolling walker (2 wheeled) Gait Pattern/deviations: Step-to pattern;Decreased step length - right;Decreased step length - left     General Gait Details: cues for sequence, PWB, RW distance form self  Stairs            Wheelchair Mobility    Modified Rankin (Stroke Patients Only)       Balance Overall balance assessment: Needs assistance           Standing balance-Leahy Scale: Poor                               Pertinent Vitals/Pain Pain Assessment: 0-10 Pain Score: 2  Pain Location: left hip  Pain Descriptors / Indicators: Sore Pain Intervention(s): Limited activity within patient's tolerance;Monitored during session;Premedicated before session;Repositioned;Ice applied    Home Living Family/patient expects to be discharged to:: Private residence Living Arrangements: Spouse/significant other Available Help at Discharge: Family;Available 24 hours/day Type of Home: House Home Access: Stairs to enter   [employer] of Steps: 1 Home Layout: One level Home Equipment: Crutches;Walker - 2 wheels;Cane - single point;Wheelchair - manual      Prior Function Level of Independence: Independent               Hand Dominance        Extremity/Trunk Assessment   Upper Extremity Assessment: Defer to OT evaluation           Lower Extremity Assessment: LLE deficits/detail   LLE Deficits / Details: AAROM grossly WFL; strength at least 3/5 but limited by pain at hip intermittently     Communication   Communication: No difficulties  Cognition Arousal/Alertness: Awake/alert Behavior During Therapy: WFL for tasks assessed/performed Overall Cognitive Status: Within Functional Limits for tasks assessed  General Comments      Exercises Total Joint Exercises Ankle Circles/Pumps: AROM;Both;10 reps      Assessment/Plan    PT Assessment Patient needs continued PT services  PT Diagnosis Difficulty walking   PT Problem List Decreased strength;Decreased balance;Decreased knowledge of use of DME;Decreased mobility  PT Treatment Interventions DME instruction;Gait training;Functional mobility training;Stair  training;Therapeutic activities;Therapeutic exercise;Patient/family education   PT Goals (Current goals can be found in the Care Plan section) Acute Rehab PT Goals Patient Stated Goal: I  PT Goal Formulation: With patient Time For Goal Achievement: 01/06/14 Potential to Achieve Goals: Good    Frequency 7X/week   Barriers to discharge        Co-evaluation               End of Session Equipment Utilized During Treatment: Gait belt;Left knee immobilizer Activity Tolerance: Patient tolerated treatment well Patient left: with call bell/phone within reach;in chair Nurse Communication: Mobility status         Time: 1002-1028 PT Time Calculation (min) (ACUTE ONLY): 26 min   Charges:   PT Evaluation $Initial PT Evaluation Tier I: 1 Procedure PT Treatments $Gait Training: 23-37 mins   PT G Codes:          Rubens Cranston 2014/01/21, 10:52 AM

## 2014-01-02 NOTE — Progress Notes (Signed)
Physical Therapy Treatment Patient Details Name: Muneer Leider MRN: [id number] DOB: 03/28/1950 Today's Date: 01/02/2014    History of Present Illness s/p acetabular revision L hip PMHx: L THA, THA revision, HTN    PT Comments    POD # 1 pm session.  Pt OOB in recliner.  Assisted to BR then amb in hallway.  Pt tolerating increased amb distance.  Progressing well.    Follow Up Recommendations  Home health PT     Equipment Recommendations  None recommended by PT    Recommendations for Other Services       Precautions / Restrictions Precautions Precautions: Posterior Hip;Fall Precaution Comments: L KI left on for this session--orders unclear--pt states Dr. Alvan Dame wanted him to use it as a reminder.  Multiple dislocations Required Braces or Orthoses: Knee Immobilizer - Left Restrictions Weight Bearing Restrictions: Yes LLE Weight Bearing: Partial weight bearing LLE Partial Weight Bearing Percentage or Pounds: 50%    Mobility  Bed Mobility               General bed mobility comments: Pt OOB in recliner  Transfers Overall transfer level: Needs assistance Equipment used: Rolling walker (2 wheeled) Transfers: Sit to/from Stand Sit to Stand: Min guard         General transfer comment: increased time and <25% VC's on safety with turns  Ambulation/Gait Ambulation/Gait assistance: Min guard;Min assist Ambulation Distance (Feet): 115 Feet Assistive device: Rolling walker (2 wheeled) Gait Pattern/deviations: Step-to pattern;Decreased stance time - left;Trunk flexed Gait velocity: decreased   General Gait Details: cues for sequence, PWB, RW distance form self   Stairs            Wheelchair Mobility    Modified Rankin (Stroke Patients Only)       Balance                                    Cognition Arousal/Alertness: Awake/alert Behavior During Therapy: WFL for tasks assessed/performed Overall Cognitive Status: Within Functional Limits  for tasks assessed                      Exercises      General Comments        Pertinent Vitals/Pain Pain Assessment: 0-10 Pain Score: 2  Pain Location: L hip Pain Descriptors / Indicators: Sore Pain Intervention(s): Monitored during session;Premedicated before session;Repositioned;Ice applied    Home Living Family/patient expects to be discharged to:: Private residence Living Arrangements: Spouse/significant other Available Help at Discharge: Family;Available 24 hours/day Type of Home: House Home Access: Stairs to enter   Home Layout: One level Home Equipment: Crutches;Walker - 2 wheels;Cane - single point;Wheelchair - manual;Shower seat;Grab bars - toilet;Grab bars - tub/shower;Adaptive equipment      Prior Function Level of Independence: Independent          PT Goals (current goals can now be found in the care plan section) Progress towards PT goals: Progressing toward goals    Frequency  7X/week    PT Plan      Co-evaluation             End of Session Equipment Utilized During Treatment: Gait belt;Left knee immobilizer Activity Tolerance: Patient tolerated treatment well Patient left: with call bell/phone within reach;in chair     Time: 1456-1520 PT Time Calculation (min) (ACUTE ONLY): 24 min  Charges:  $Gait Training: 8-22 mins $Therapeutic Activity: 8-22 mins  G Codes:      Rica Koyanagi  PTA [facility]  Acute  Rehab Pager      [phone]

## 2014-01-03 LAB — CBC
HEMATOCRIT: 39.9 % (ref 39.0–52.0)
HEMOGLOBIN: 13 g/dL (ref 13.0–17.0)
MCH: 30.8 pg (ref 26.0–34.0)
MCHC: 32.6 g/dL (ref 30.0–36.0)
MCV: 94.5 fL (ref 78.0–100.0)
Platelets: 241 10*3/uL (ref 150–400)
RBC: 4.22 MIL/uL (ref 4.22–5.81)
RDW: 13.2 % (ref 11.5–15.5)
WBC: 11.5 10*3/uL — ABNORMAL HIGH (ref 4.0–10.5)

## 2014-01-03 LAB — BASIC METABOLIC PANEL
Anion gap: 8 (ref 5–15)
BUN: 16 mg/dL (ref 6–23)
CALCIUM: 8.8 mg/dL (ref 8.4–10.5)
CO2: 30 mEq/L (ref 19–32)
Chloride: 99 mEq/L (ref 96–112)
Creatinine, Ser: 0.98 mg/dL (ref 0.50–1.35)
GFR calc Af Amer: >90 mL/min (ref >90)
GFR calc non Af Amer: 84 mL/min — ABNORMAL LOW (ref >90)
GLUCOSE: 127 mg/dL — AB (ref 70–99)
Potassium: 4 mEq/L (ref 3.7–5.3)
Sodium: 137 mEq/L (ref 137–147)

## 2014-01-03 MED ORDER — TIZANIDINE HCL 4 MG PO CAPS: 4.0000 mg | ORAL_CAPSULE | Freq: Three times a day (TID) | ORAL | Status: DC | PRN

## 2014-01-03 MED ORDER — POLYETHYLENE GLYCOL 3350 17 G PO PACK: 17.0000 g | PACK | Freq: Every day | ORAL | Status: DC | PRN

## 2014-01-03 MED ORDER — FERROUS SULFATE 325 (65 FE) MG PO TABS: 325.0000 mg | ORAL_TABLET | Freq: Two times a day (BID) | ORAL | Status: DC

## 2014-01-03 MED ORDER — ASPIRIN 325 MG PO TBEC: 325.0000 mg | DELAYED_RELEASE_TABLET | Freq: Two times a day (BID) | ORAL | Status: AC

## 2014-01-03 MED ORDER — HYDROCODONE-ACETAMINOPHEN 7.5-325 MG PO TABS: 1.0000 | ORAL_TABLET | ORAL | Status: DC | PRN

## 2014-01-03 MED ORDER — DSS 100 MG PO CAPS: 100.0000 mg | ORAL_CAPSULE | Freq: Two times a day (BID) | ORAL | Status: DC

## 2014-01-03 NOTE — Progress Notes (Signed)
RN reviewed discharge instructions with patient and family. All questions answered.   Paperwork and prescriptions given.   NT rolled patient down to family car.   

## 2014-01-03 NOTE — Progress Notes (Signed)
Physical Therapy Treatment Patient Details Name: Gregory Norton MRN: [id number] DOB: 1950-07-13 Today's Date: 01/03/2014    History of Present Illness s/p acetabular revision L hip PMHx: L THA, THA revision, HTN    PT Comments    POD # 2 am session.  Pt eager to D/C to home.  Assisted with amb in hallway then performed TE's.  Follow Up Recommendations  Home health PT     Equipment Recommendations       Recommendations for Other Services       Precautions / Restrictions Precautions Precautions: Posterior Hip;Fall Precaution Comments: pt states MD instructed him to wear KI for 3 weeks/may remove for ADL's    (no clear orders in chart) Required Braces or Orthoses: Knee Immobilizer - Left Restrictions Weight Bearing Restrictions: Yes LLE Weight Bearing: Partial weight bearing LLE Partial Weight Bearing Percentage or Pounds: 50%    Mobility  Bed Mobility               General bed mobility comments: Pt OOB in recliner  Transfers Overall transfer level: Needs assistance Equipment used: Rolling walker (2 wheeled) Transfers: Sit to/from Stand Sit to Stand: Supervision         General transfer comment: good use of hands and good safety cognition  Ambulation/Gait Ambulation/Gait assistance: Supervision Ambulation Distance (Feet): 185 Feet Assistive device: Rolling walker (2 wheeled) Gait Pattern/deviations: Step-to pattern;Decreased stance time - left Gait velocity: decreased   General Gait Details: cues for sequence, PWB, RW distance form self   Stairs Stairs:  (no stairs to enter home)          Wheelchair Mobility    Modified Rankin (Stroke Patients Only)       Balance                                    Cognition                            Exercises  TE's 10 reps ankle pumps 10 reps knee presses 10 reps heel slides 10 reps SAQ's 10 reps ABD Followed by [facility]     General Comments        Pertinent  Vitals/Pain Pain Assessment: 0-10 Pain Score: 2  Pain Location: L hip Pain Descriptors / Indicators: Sore Pain Intervention(s): Monitored during session;Premedicated before session;Repositioned;Ice applied    Home Living                      Prior Function            PT Goals (current goals can now be found in the care plan section) Progress towards PT goals: Progressing toward goals    Frequency  7X/week    PT Plan      Co-evaluation             End of Session Equipment Utilized During Treatment: Gait belt;Left knee immobilizer Activity Tolerance: Patient tolerated treatment well Patient left: with call bell/phone within reach;in chair     Time: 7829-5621 PT Time Calculation (min) (ACUTE ONLY): 23 min  Charges:  $Gait Training: 8-22 mins $Therapeutic Exercise: 8-22 mins                    G Codes:      Rica Koyanagi  PTA [facility]  Acute  Rehab Pager  [phone]  

## 2014-01-03 NOTE — Progress Notes (Signed)
Patient ID: Gregory Norton, male   DOB: August 23, 1950, 63 y.o.   MRN: [id number] Subjective: 2 Days Post-Op Procedure(s) (LRB): ACETABULAR REVISION (Left)    Patient reports pain as mild.  Feels that he is doing well.  No complaints or concerns expressed this am  Objective:   VITALS:   Filed Vitals:   01/03/14 0606  BP: 136/70  Pulse: 64  Temp: 99.1 F (37.3 C)  Resp: 18    Neurovascular intact Incision: dressing C/D/I  LABS  Recent Labs  01/02/14 0536 01/03/14 0514  HGB 14.5 13.0  HCT 44.3 39.9  WBC 11.8* 11.5*  PLT 237 241     Recent Labs  01/02/14 0536 01/03/14 0514  NA 137 137  K 4.2 4.0  BUN 17 16  CREATININE 0.93 0.98  GLUCOSE 124* 127*    No results for input(s): LABPT, INR in the last 72 hours.   Assessment/Plan: 2 Days Post-Op Procedure(s) (LRB): ACETABULAR REVISION (Left)   Up with therapy Discharge home with home health today after therapy

## 2014-01-03 NOTE — Clinical Social Work Note (Signed)
Referred to CSW   for ?SNF. Chart reviewed and have spoken with [facility] and Care Coordinator who indicate patient plans to d/c home with [facility] and DME. CSW to sign off- please contact us if SW needs arise. Eduard Clos, MSW, [place]

## 2014-01-04 ENCOUNTER — Encounter (HOSPITAL_COMMUNITY): Payer: Self-pay | Admitting: Orthopedic Surgery

## 2014-01-04 NOTE — OR Nursing (Signed)
Update to reflect change in time the patient entered the room. 01/04/14 Gregory Norton

## 2014-01-12 NOTE — Discharge Summary (Signed)
Physician Discharge Summary  Patient ID: Gregory Norton MRN: [id number] DOB/AGE: 1950/08/06 63 y.o.  Admit date: 12/29/2013 Discharge date: 01/03/2014   Procedures:  Procedure(s) (LRB): ACETABULAR REVISION (Left)  Attending Physician:  Dr. Paralee Cancel   Admission Diagnoses:   Left hip pain and instability s/p fall  Discharge Diagnoses:  Active Problems:   Failed total hip arthroplasty  Past Medical History  Diagnosis Date  . Hypertension   . GERD (gastroesophageal reflux disease)   . Osteoporosis   . Hypercholesteremia   . Sleep apnea     no CPAP  . Cancer 2014    prostate cancer  . Arthritis   . Complication of anesthesia     low O2 sats, could not wake up after hip surgery    HPI: Gregory Norton, 63 y.o. male, s/p multiple hip surgeries presenting to the ED for left hip pain. Patient states after he fell four days ago he has felt very unstable on his left hip. He states he feels "it is sliding in the socket without complete dislocation." He states he only has pain when his hip "slides around." No further falls since the initial fall. He present to [place] an saw Dr. Alvan Dame with continued pain in the left hip. Dr. Alvan Dame and the patient discussed the fall, his symptoms including increasing pain and instability. Alvan Dame believe that the cemented cup has most likely loosened and causing the feelings of instability. He is admitted to the hospital with plans for Dr. Alvan Dame to do a revision of the left hip on Friday, 12/18/215. Risks, benefits and expectations were discussed with the patient. Risks including but not limited to the risk of anesthesia, blood clots, nerve damage, blood vessel damage, failure of the prosthesis, infection and up to and including death. Patient understand the risks, benefits and expectations and wishes to proceed with surgery.    PCP: Robyne Peers., MD   Discharged Condition: good  Hospital Course:  Patient was admitted to the hospital  because of instability on 12/29/2013. He had an uneventful hospital until surgery.  Patient underwent the above stated procedure on 01/01/2014.  Patient tolerated the procedure well and brought to the recovery room in good condition and subsequently to the floor.  POD #1 BP: 122/65 ; Pulse: 62 ; Temp: 98.9 F (37.2 C) ; Resp: 18 Patient reports pain as mild. Doing well this am, no major pain complaints. Dorsiflexion/plantar flexion intact, incision: dressing C/D/I, no cellulitis present and compartment soft.   LABS  Basename    HGB  14.5  HCT  44.3   POD #2  BP: 136/70 ; Pulse: 64 ; Temp: 99.1 F (37.3 C) ; Resp: 18 Patient reports pain as mild. Feels that he is doing well. No complaints or concerns expressed this am. Dorsiflexion/plantar flexion intact, incision: dressing C/D/I, no cellulitis present and compartment soft.   LABS  Basename    HGB  13.0  HCT  39.9    Discharge Exam: General appearance: alert, cooperative and no distress Extremities: Homans sign is negative, no sign of DVT, no edema, redness or tenderness in the calves or thighs and no ulcers, gangrene or trophic changes  Disposition: Home with follow up in 2 weeks   Follow-up Information    Follow up with [facility].   Why:  Home Health Physical Therapy   Contact information:   [place] [phone]       Follow up with [name], MD. Schedule  an appointment as soon as possible for a visit in 2 weeks.   Specialty:  Orthopedic Surgery   Contact information:   [address] [place] [address] [phone]       Discharge Instructions    Call MD / Call 911    Complete by:  As directed   If you experience chest pain or shortness of breath, CALL 911 and be transported to the hospital emergency room.  If you develope a fever above 101 F, pus (white drainage) or increased drainage or redness at the wound, or calf pain, call your  surgeon's office.     Change dressing    Complete by:  As directed   Maintain surgical dressing until follow up in the clinic. If the edges start to pull up, may reinforce with tape. If the dressing is no longer working, may remove and cover with gauze and tape, but must keep the area dry and clean.  Call with any questions or concerns.     Constipation Prevention    Complete by:  As directed   Drink plenty of fluids.  Prune juice may be helpful.  You may use a stool softener, such as Colace (over the counter) 100 mg twice a day.  Use MiraLax (over the counter) for constipation as needed.     Diet - low sodium heart healthy    Complete by:  As directed      Discharge instructions    Complete by:  As directed   Maintain surgical dressing until follow up in the clinic. If the edges start to pull up, may reinforce with tape. If the dressing is no longer working, may remove and cover with gauze and tape, but must keep the area dry and clean.  Follow up in 2 weeks at [facility]. Call with any questions or concerns.     Driving restrictions    Complete by:  As directed   No driving for 4 weeks     Partial weight bearing    Complete by:  As directed   % Body Weight:  50  Laterality:  left  Extremity:  Lower     TED hose    Complete by:  As directed   Use stockings (TED hose) for 2 weeks on both leg(s).  You may remove them at night for sleeping.             Medication List    STOP taking these medications        diclofenac sodium 1 % Gel  Commonly known as:  VOLTAREN      TAKE these medications        aspirin 325 MG EC tablet  Take 1 tablet (325 mg total) by mouth 2 (two) times daily.     calcium carbonate 500 MG chewable tablet  Commonly known as:  TUMS - dosed in mg elemental calcium  Chew 1 tablet by mouth every evening.     diltiazem 300 MG 24 hr capsule  Commonly known as:  CARDIZEM CD  Take 300 mg by mouth every evening.     DSS 100 MG Caps  Take 100 mg  by mouth 2 (two) times daily.     ferrous sulfate 325 (65 FE) MG tablet  Take 1 tablet (325 mg total) by mouth 2 (two) times daily with a meal.     HYDROcodone-acetaminophen 7.5-325 MG per tablet  Commonly known as:  NORCO  Take 1-2 tablets by mouth every  4 (four) hours as needed for moderate pain.     magnesium oxide 400 MG tablet  Commonly known as:  MAG-OX  Take 400 mg by mouth every evening.     multivitamin with minerals Tabs tablet  Take 1 tablet by mouth every evening.     pantoprazole 40 MG tablet  Commonly known as:  PROTONIX  Take 40 mg by mouth every morning.     polyethylene glycol packet  Commonly known as:  MIRALAX / GLYCOLAX  Take 17 g by mouth daily as needed for mild constipation.     simvastatin 10 MG tablet  Commonly known as:  ZOCOR  Take 10 mg by mouth every evening.     tiZANidine 4 MG capsule  Commonly known as:  ZANAFLEX  Take 1 capsule (4 mg total) by mouth 3 (three) times daily as needed for muscle spasms.     vitamin C 1000 MG tablet  Take 1,000 mg by mouth every evening.         Signed: [place] P[place]. Shaun Zuccaro   PA-C  01/12/2014, 2:20 PM

## 2014-01-27 ENCOUNTER — Encounter (HOSPITAL_COMMUNITY): Payer: Self-pay | Admitting: Orthopedic Surgery

## 2015-12-22 IMAGING — CR DG FOREARM 2V*L*
2 series · 2 of 2 positions shown · non-contrast
Comparison: None.

CLINICAL DATA: Fall yesterday w/ lt distal radius pain, Difficult
for pt to bend elbow

EXAM:
LEFT FOREARM - 2 VIEW

[x forearm ap left]
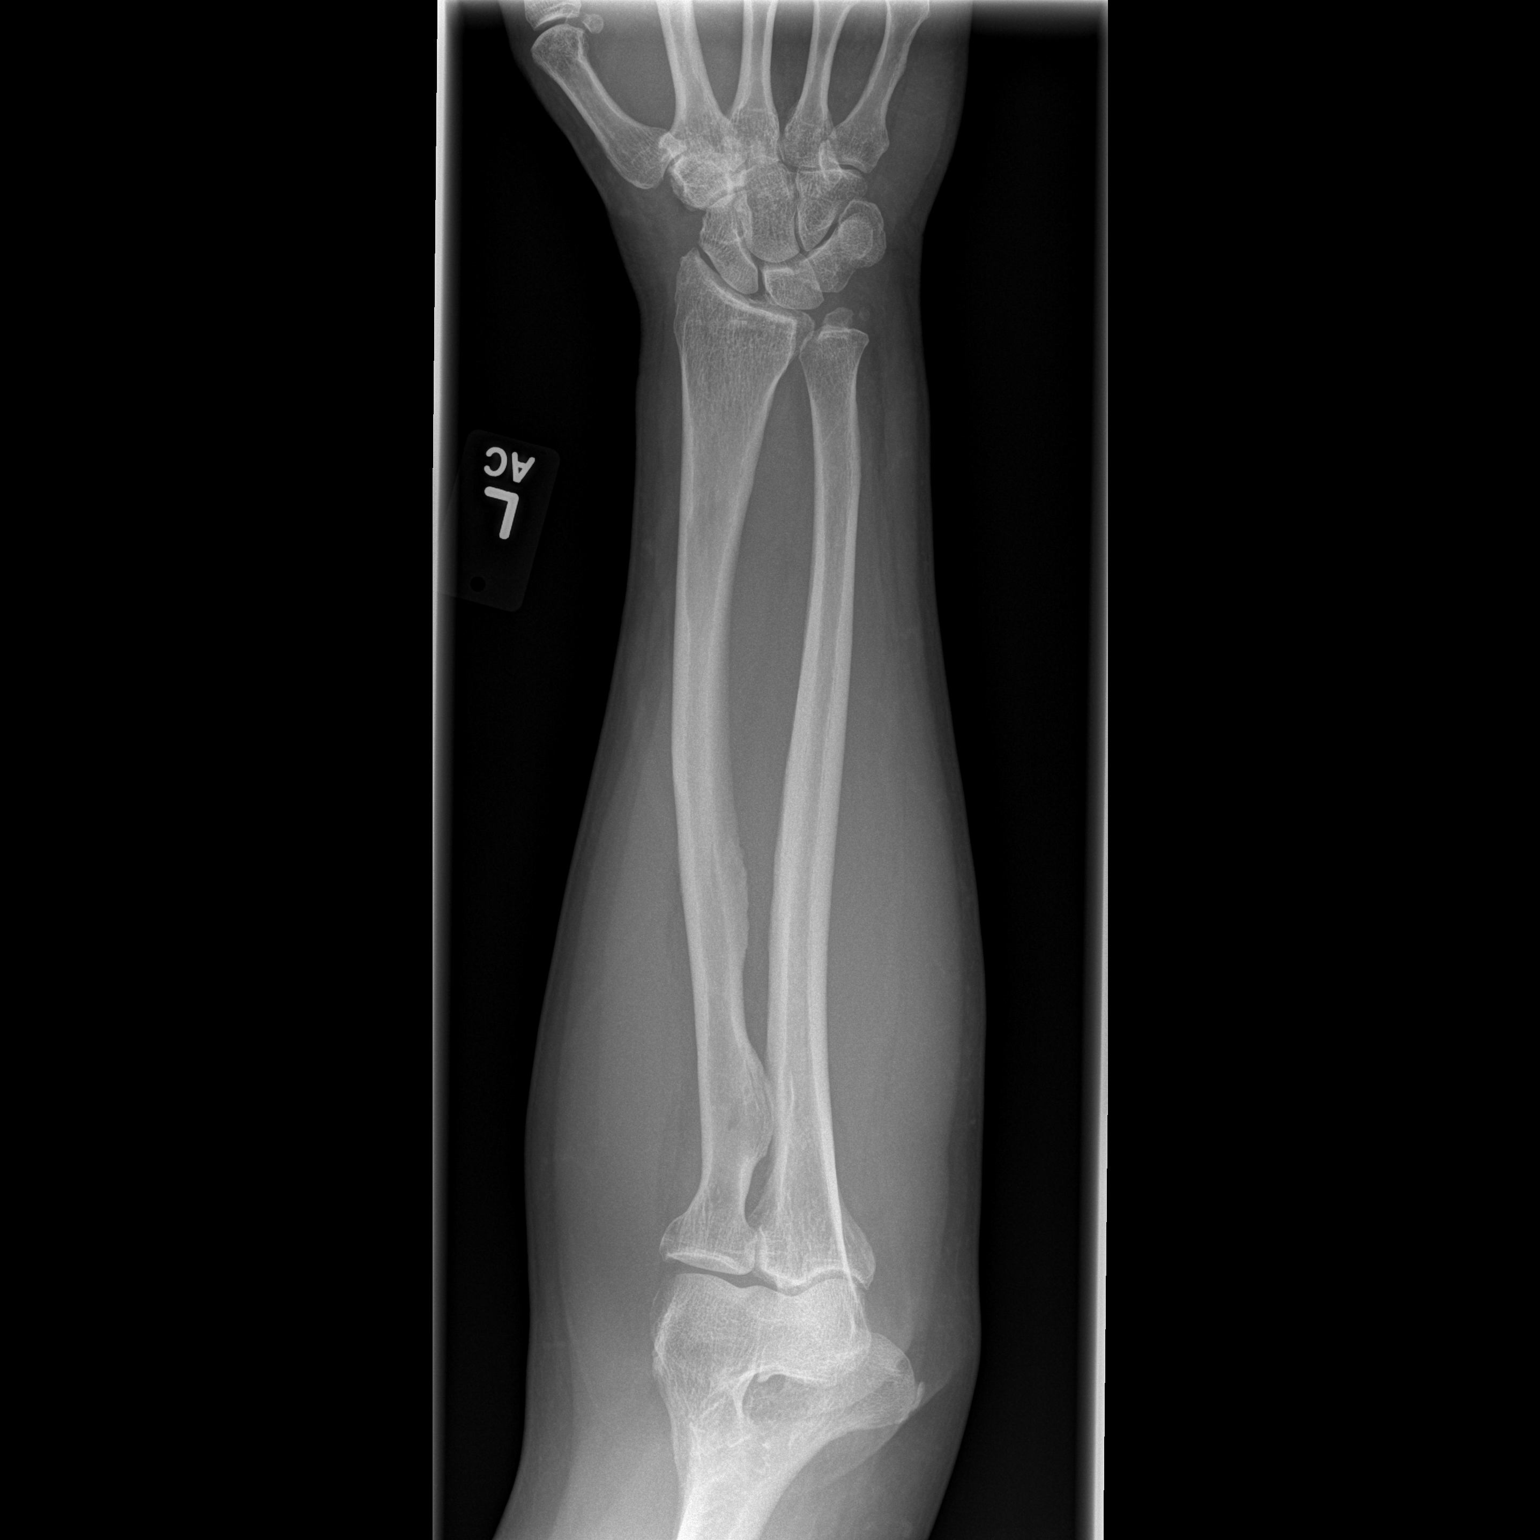

[x forearm lat left]
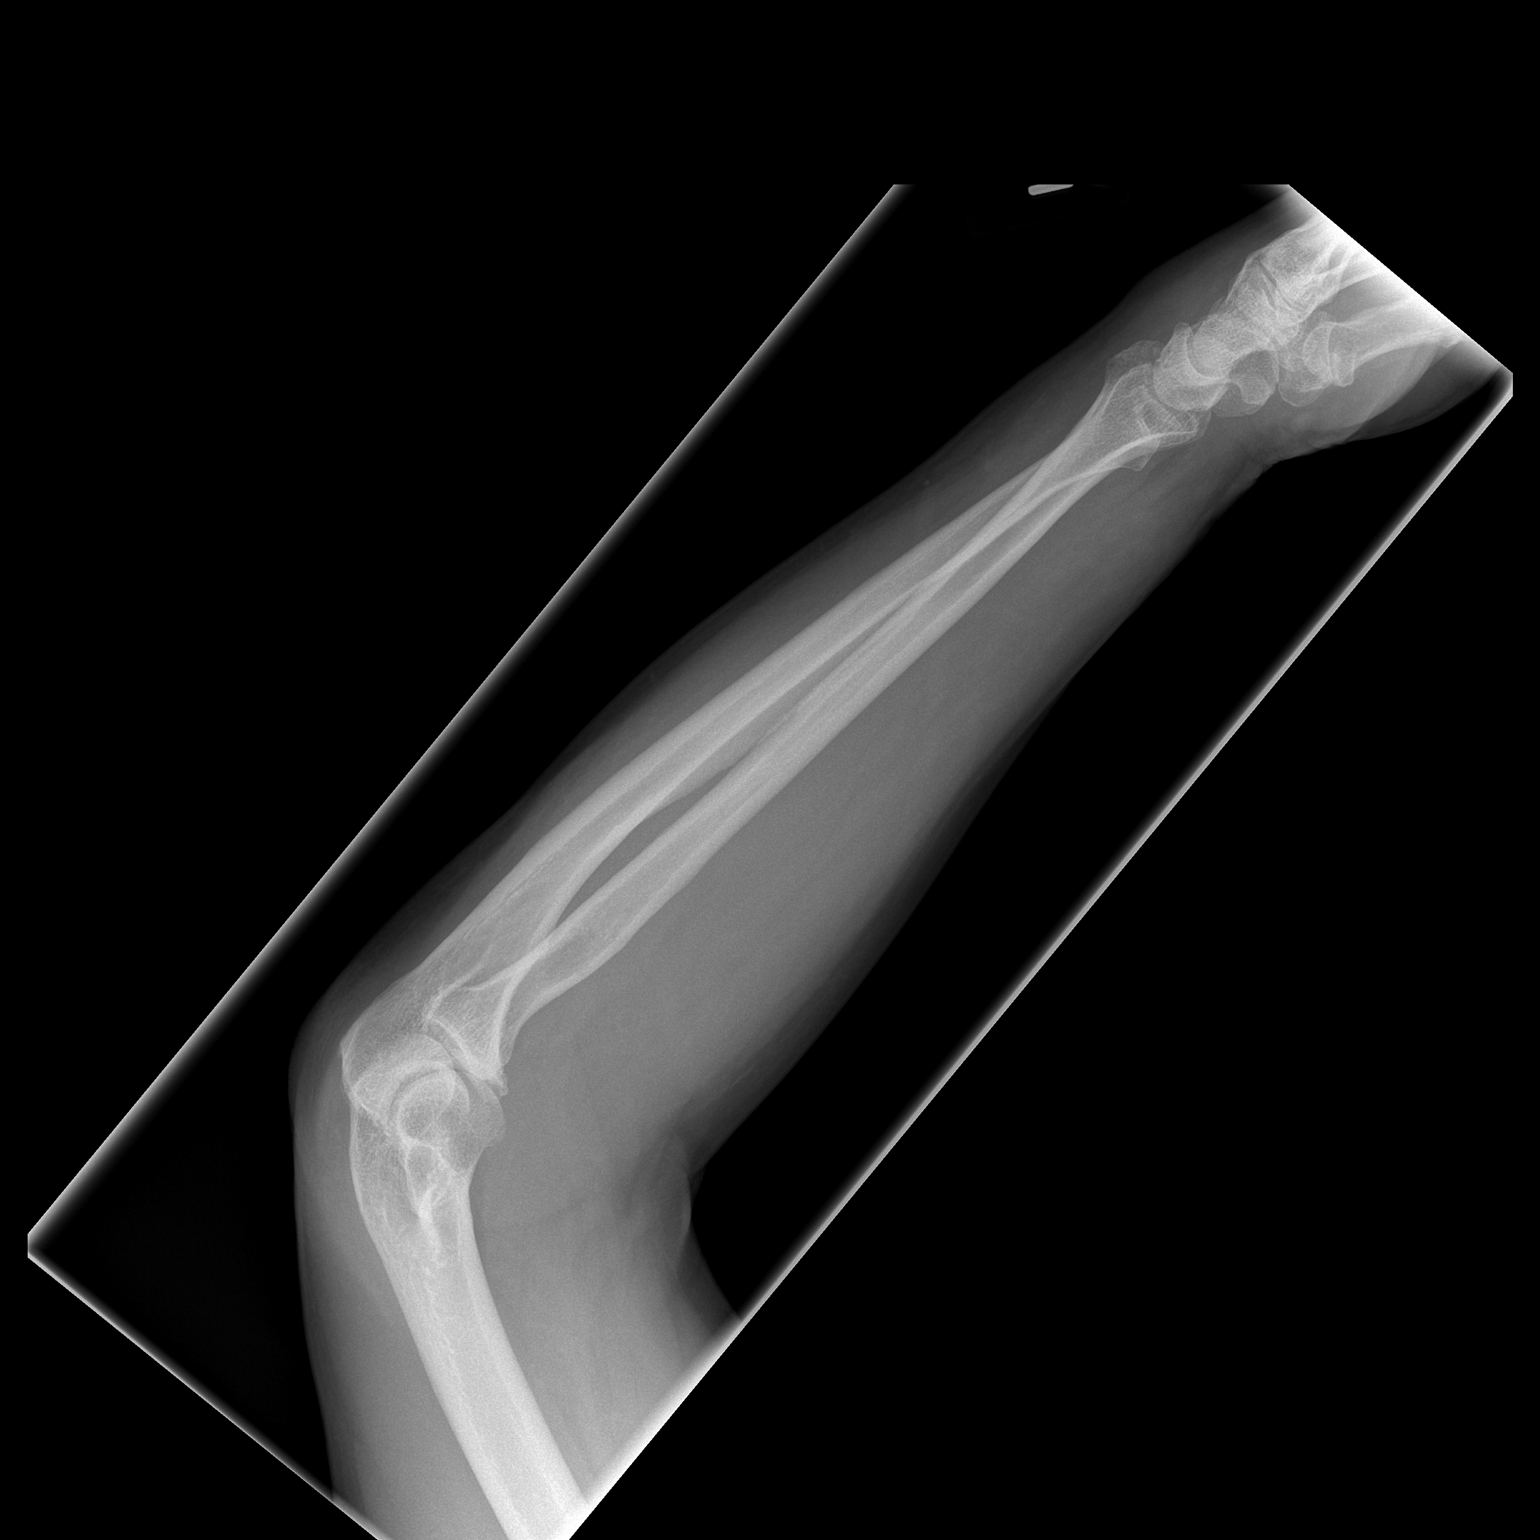

[2 of 2 positions shown; findings below may reference images not displayed]

FINDINGS: There is no evidence of fracture or other focal bone lesions.
Corticated ossicle adjacent to the ulnar styloid. Soft tissues are
unremarkable.
IMPRESSION: Negative.

## 2015-12-22 IMAGING — CR DG WRIST COMPLETE 3+V*L*
4 series · 4 of 4 positions shown · non-contrast
Comparison: 12/26/2013 forearm

CLINICAL DATA: Fell yesterday. Braced himself with the left wrist.
Pain near the base of the thumb today with swelling.

EXAM:
LEFT WRIST - COMPLETE 3+ VIEW

[x wrist pa left]
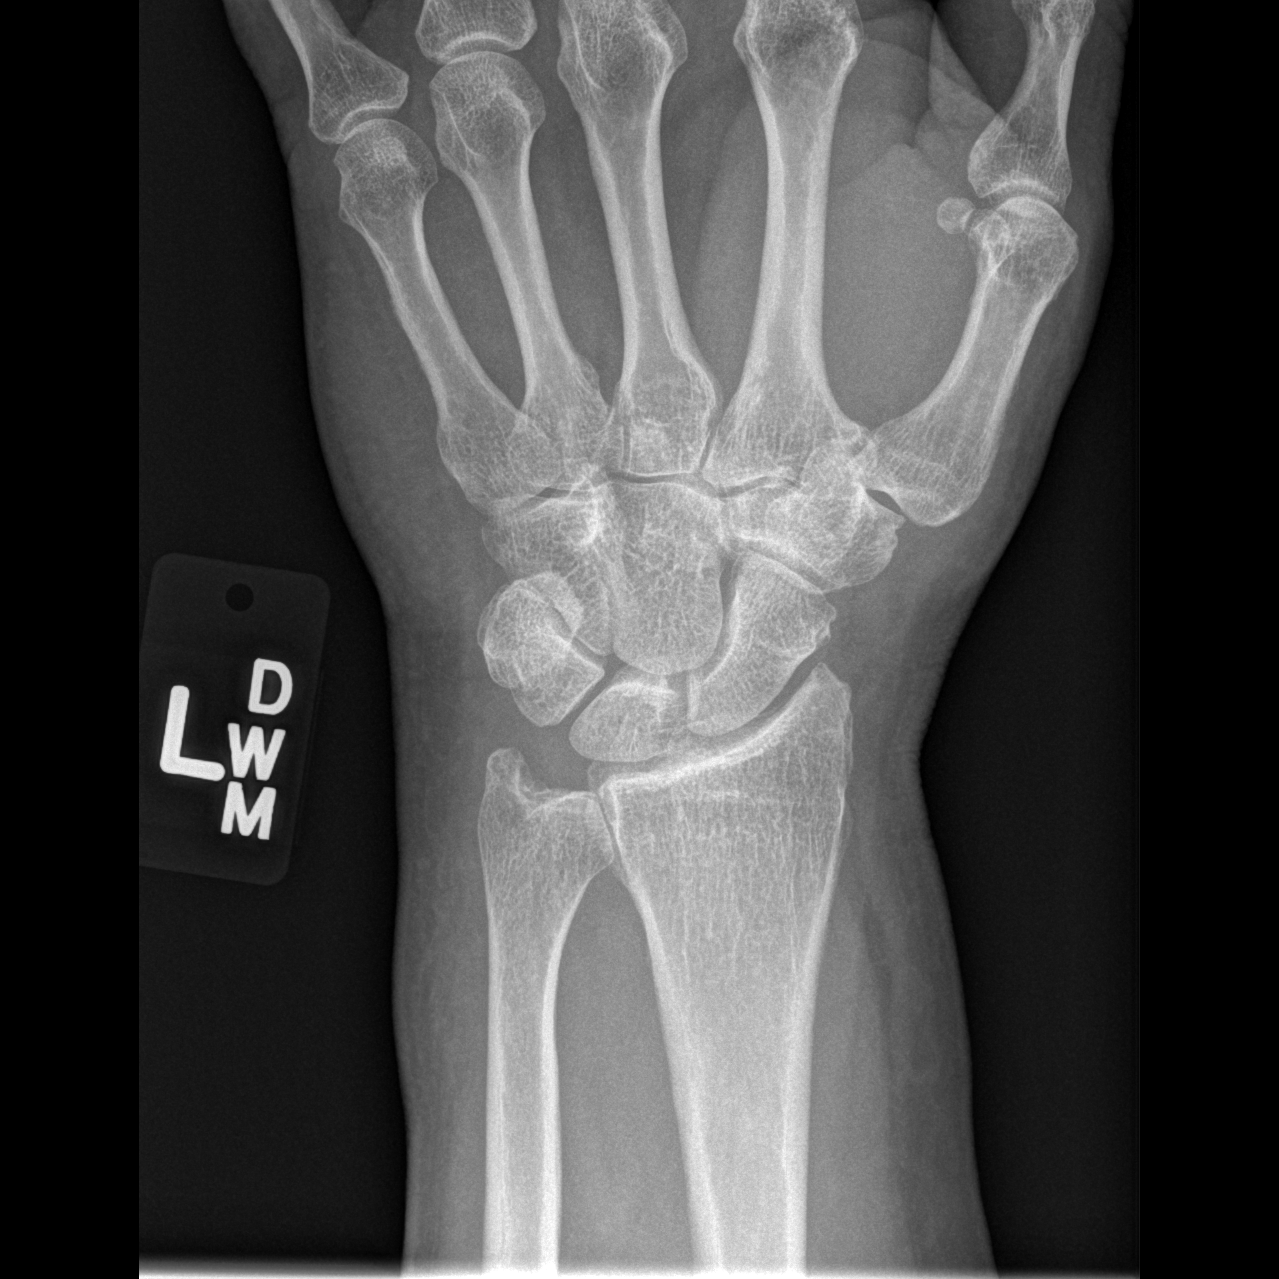

[x wrist obl left]
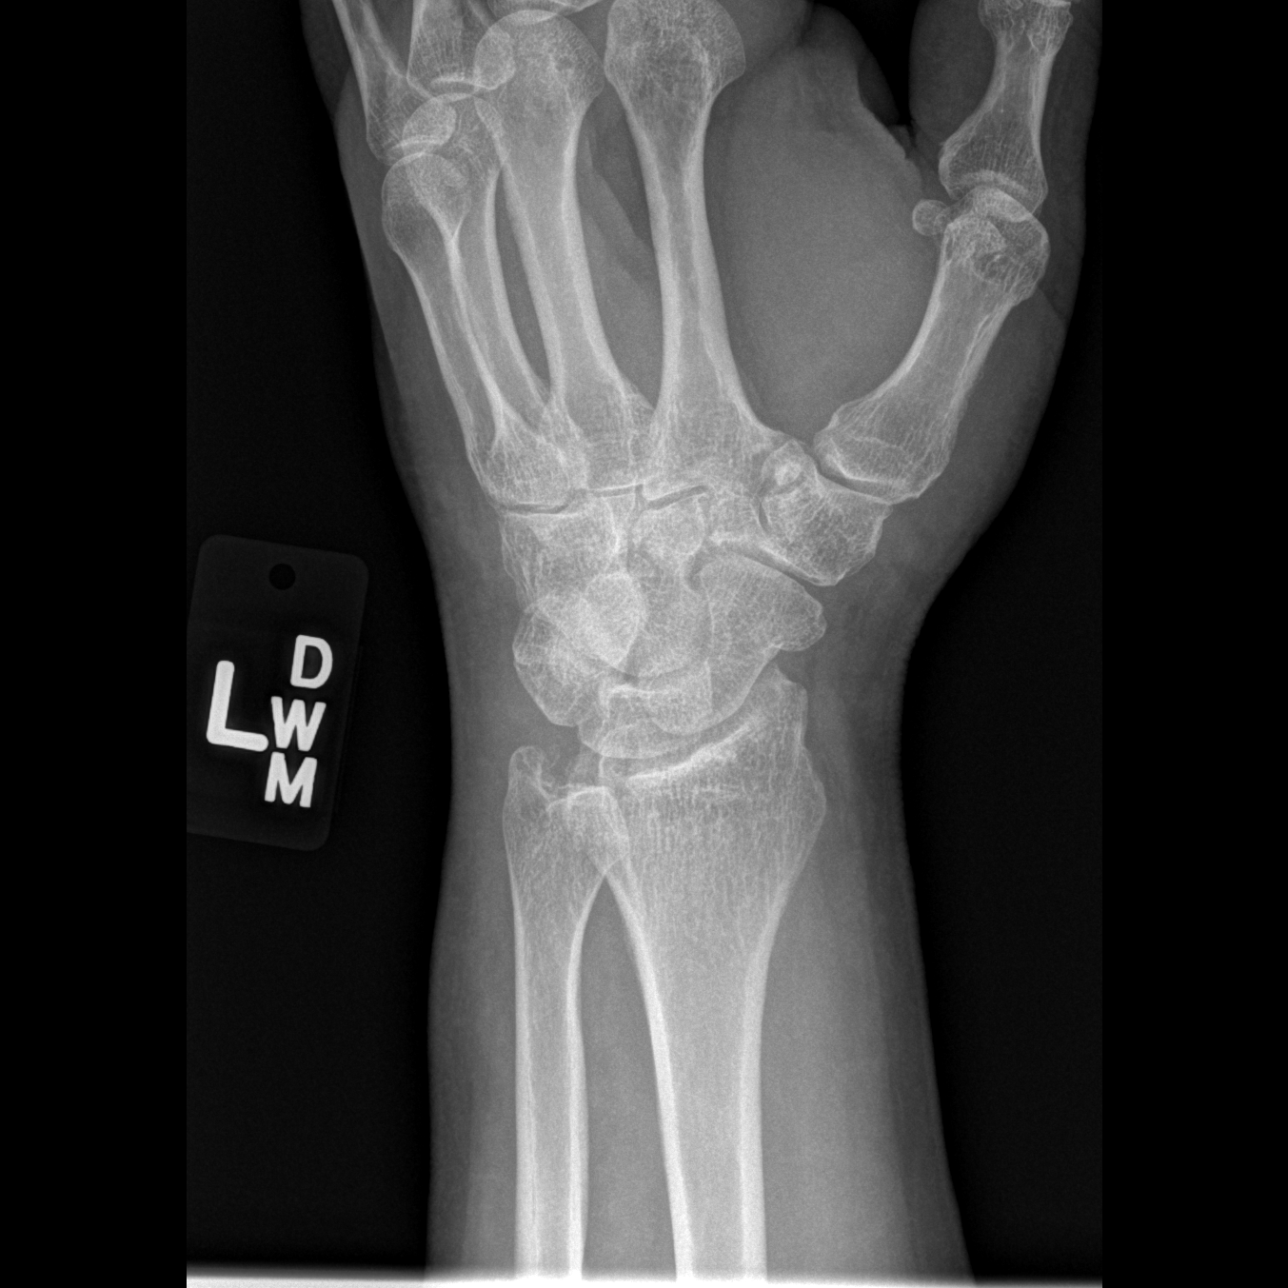

[x wrist lat left]
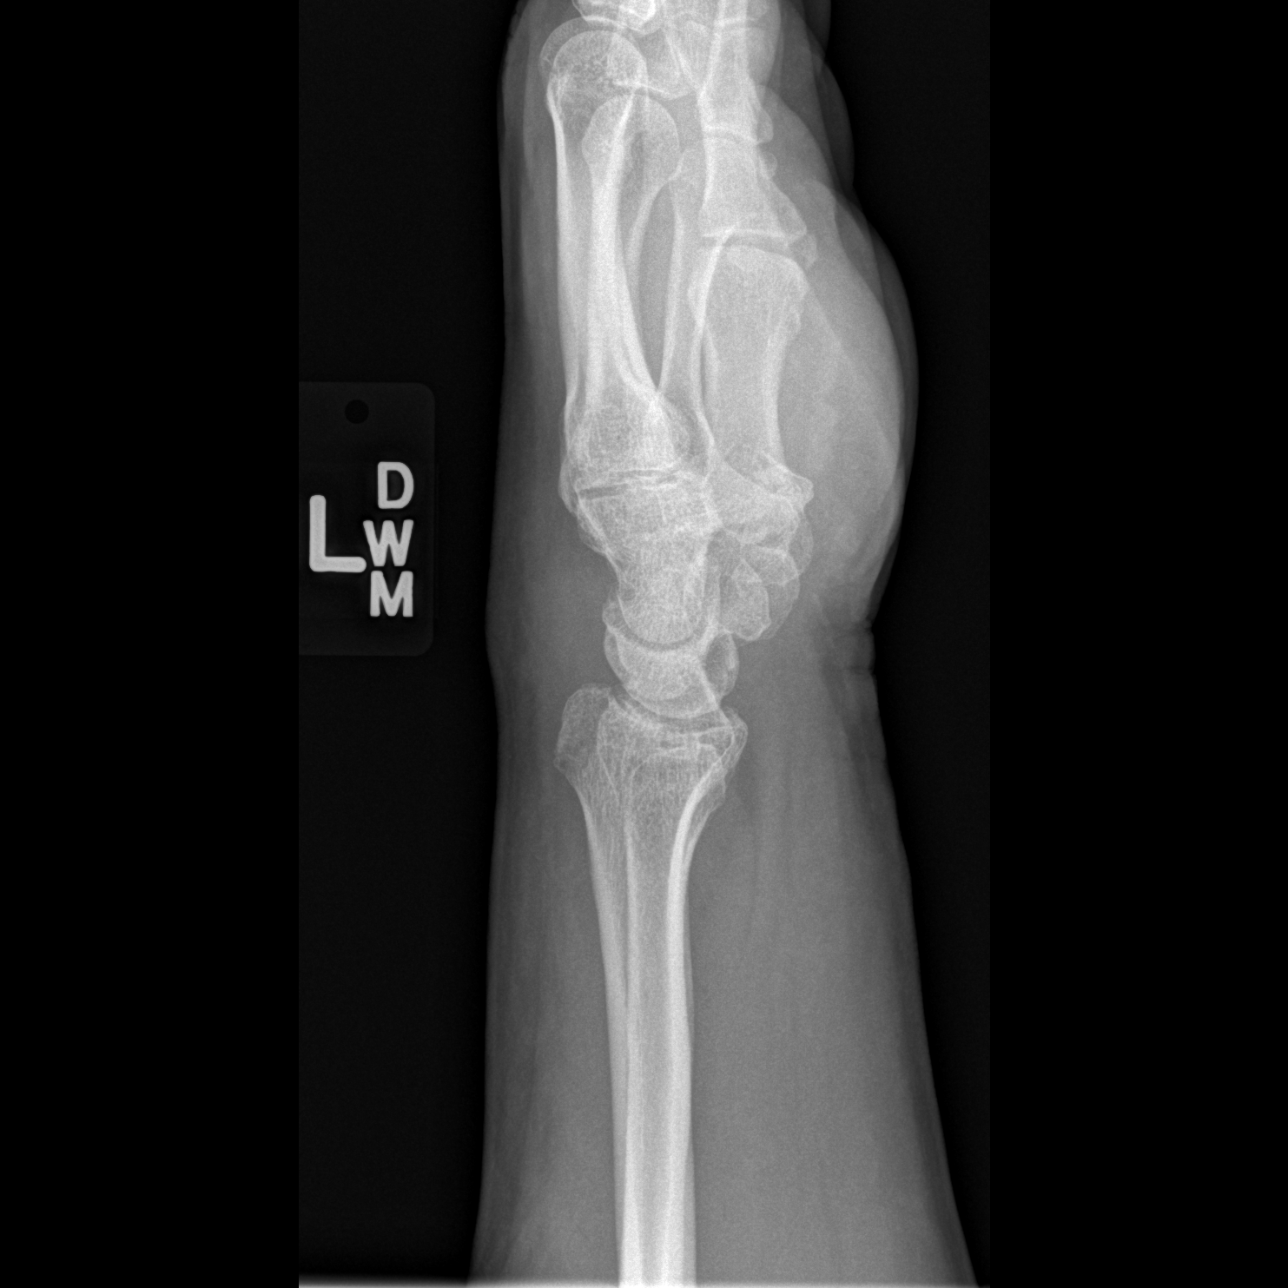

[x navicular]
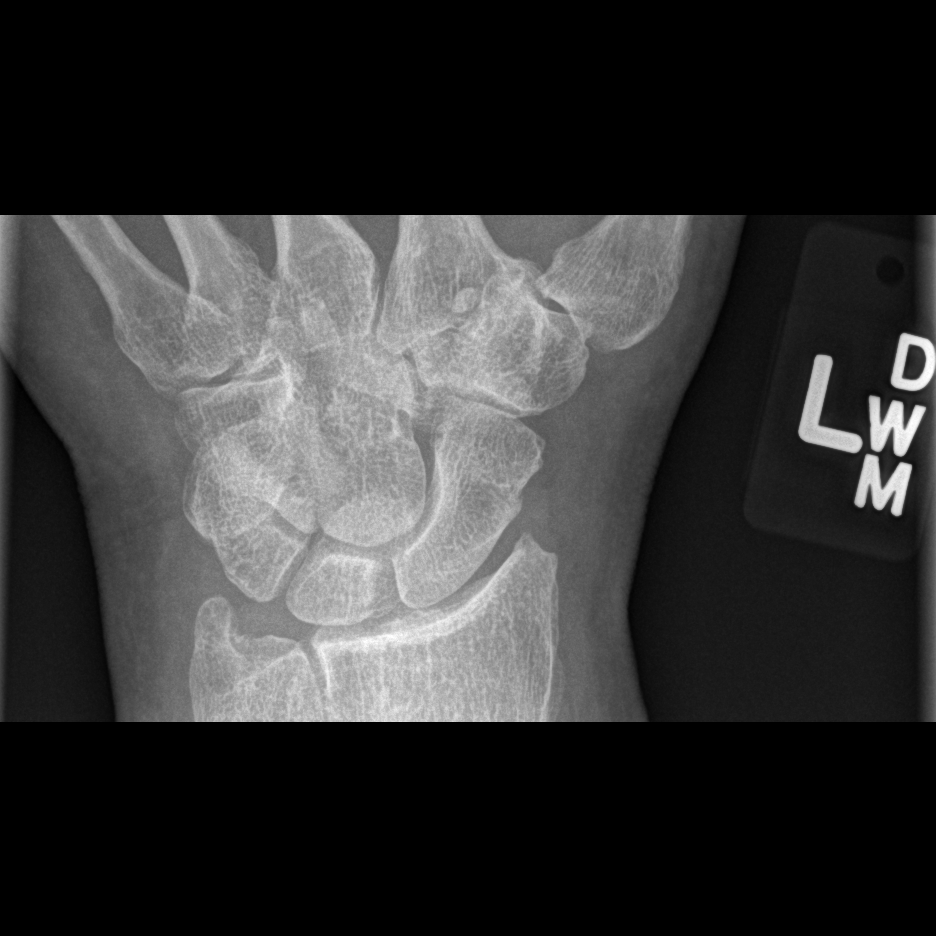

[4 of 4 positions shown; findings below may reference images not displayed]

FINDINGS: There is mild degenerative change in the wrist. Suspect old ulnar
styloid process fracture. No acute fracture or dislocation. No
radiopaque foreign body or soft tissue gas.
IMPRESSION: No evidence for acute  abnormality.

## 2015-12-28 IMAGING — DX DG PORTABLE PELVIS
1 series · 1 of 1 positions shown · non-contrast
Comparison: CT, 12/28/2013

CLINICAL DATA: Left hip arthroplasty

EXAM:
PORTABLE PELVIS 1-2 VIEWS

[pelvis ap]
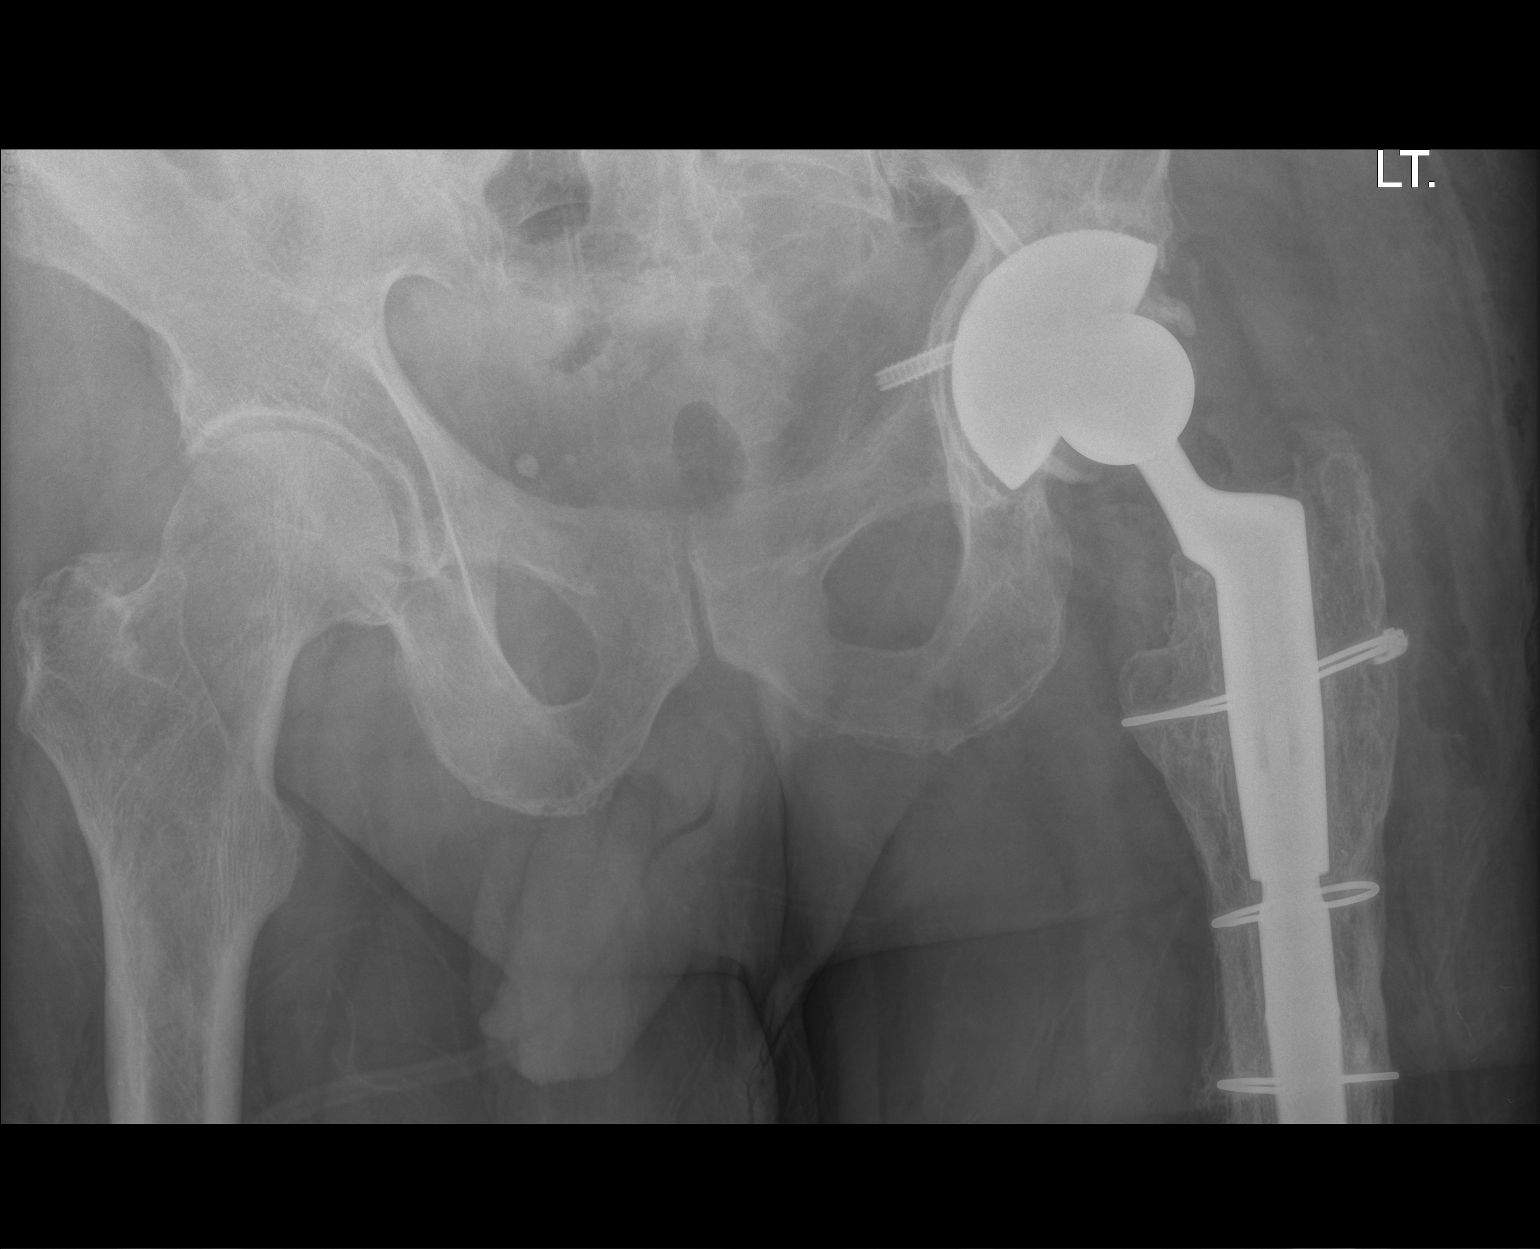

[1 of 1 positions shown; findings below may reference images not displayed]

FINDINGS: Single AP image shows a left hip prosthesis with the femoral and
acetabular components well aligned. The superior subluxation noted
on the prior CT is no longer evident. There is no acute fracture or
evidence of an operative complication. Lucency surrounds the
acetabular component stable from the earlier CT.
IMPRESSION: Well aligned left hip prosthesis.

## 2019-11-09 NOTE — Patient Instructions (Addendum)
DUE TO COVID-19 ONLY ONE VISITOR IS ALLOWED TO COME WITH YOU AND STAY IN THE WAITING ROOM ONLY DURING PRE OP AND PROCEDURE DAY OF SURGERY. THE 1 VISITOR  MAY VISIT WITH YOU AFTER SURGERY IN YOUR PRIVATE ROOM DURING VISITING HOURS ONLY!  YOU NEED TO HAVE A COVID 19 TEST ON_11/1______ @__11 :15_____, THIS TEST MUST BE DONE BEFORE SURGERY,  COVID TESTING [employer] [place] [id number], IT IS ON THE RIGHT GOING OUT [address] APPROXIMATELY  2 MINUTES PAST [employer] ON THE RIGHT. ONCE YOUR COVID TEST IS COMPLETED,  PLEASE BEGIN THE QUARANTINE INSTRUCTIONS AS OUTLINED IN YOUR HANDOUT.                Lenise Arena    Your procedure is scheduled on: 11/18/19   Report to [facility] Main  Entrance   Report to admitting at  9:05 AM     Call this number if you have problems the morning of surgery [place], NO CHEWING GUM [place].   No food after midnight.   You may have clear liquid until 8:30 AM.    At 8:30 AM drink pre surgery drink.   Nothing by mouth after 8:30 AM.   Take these medicines the morning of surgery with A SIP OF WATER: Amlodipine, Pantoprazole. Bring your mask and tubing                                 You may not have any metal on your body including              piercings  Do not wear jewelry,  lotions, powders or deodorant                           Men may shave face and neck.   Do not bring valuables to the hospital. [place].  Contacts, dentures or bridgework may not be worn into surgery.       Special Instructions: N/A              Please read over the following fact sheets you were given: _____________________________________________________________________             [facility] - Preparing for Surgery Before surgery, you can play an important role.   Because skin is not sterile, your skin  needs to be as free of germs as possible.   You can reduce the number of germs on your skin by washing with CHG (chlorahexidine gluconate) soap before surgery.  ' CHG is an antiseptic cleaner which kills germs and bonds with the skin to continue killing germs even after washing. Please DO NOT use if you have an allergy to CHG or antibacterial soaps.   If your skin becomes reddened/irritated stop using the CHG and inform your nurse when you arrive at Short Stay. .  You may shave your face/neck.  Please follow these instructions carefully:  1.  Shower with CHG Soap the night before surgery and the  morning of Surgery.  2.  If you choose to wash your hair, wash your hair first as usual with your  normal  shampoo.  3.  After you shampoo,  rinse your hair and body thoroughly to remove the  shampoo.                                        4.  Use CHG as you would any other liquid soap.  You can apply chg directly  to the skin and wash                       Gently with a scrungie or clean washcloth.  5.  Apply the CHG Soap to your body ONLY FROM THE NECK DOWN.   Do not use on face/ open                           Wound or open sores. Avoid contact with eyes, ears mouth and genitals (private parts).                       Wash face,  Genitals (private parts) with your normal soap.             6.  Wash thoroughly, paying special attention to the area where your surgery  will be performed.  7.  Thoroughly rinse your body with warm water from the neck down.  8.  DO NOT shower/wash with your normal soap after using and rinsing off  the CHG Soap.             9.  Pat yourself dry with a clean towel.            10.  Wear clean pajamas.            11.  Place clean sheets on your bed the night of your first shower and do not  sleep with pets. Day of Surgery : Do not apply any lotions/deodorants the morning of surgery.  Please wear clean clothes to the hospital/surgery center.  FAILURE TO FOLLOW THESE  INSTRUCTIONS MAY RESULT IN THE CANCELLATION OF YOUR SURGERY PATIENT SIGNATURE_________________________________  NURSE SIGNATURE__________________________________  ________________________________________________________________________   Adam Phenix  An incentive spirometer is a tool that can help keep your lungs clear and active. This tool measures how well you are filling your lungs with each breath. Taking long deep breaths may help reverse or decrease the chance of developing breathing (pulmonary) problems (especially infection) following:  A long period of time when you are unable to move or be active. BEFORE THE PROCEDURE   If the spirometer includes an indicator to show your best effort, your nurse or respiratory therapist will set it to a desired goal.  If possible, sit up straight or lean slightly forward. Try not to slouch.  Hold the incentive spirometer in an upright position. INSTRUCTIONS FOR USE  1. Sit on the edge of your bed if possible, or sit up as far as you can in bed or on a chair. 2. Hold the incentive spirometer in an upright position. 3. Breathe out normally. 4. Place the mouthpiece in your mouth and seal your lips tightly around it. 5. Breathe in slowly and as deeply as possible, raising the piston or the ball toward the top of the column. 6. Hold your breath for 3-5 seconds or for as long as possible. Allow the piston or ball to fall to the bottom of the column. 7. Remove the mouthpiece from your mouth and  breathe out normally. 8. Rest for a few seconds and repeat Steps 1 through 7 at least 10 times every 1-2 hours when you are awake. Take your time and take a few normal breaths between deep breaths. 9. The spirometer may include an indicator to show your best effort. Use the indicator as a goal to work toward during each repetition. 10. After each set of 10 deep breaths, practice coughing to be sure your lungs are clear. If you have an incision (the  cut made at the time of surgery), support your incision when coughing by placing a pillow or rolled up towels firmly against it. Once you are able to get out of bed, walk around indoors and cough well. You may stop using the incentive spirometer when instructed by your caregiver.  RISKS AND COMPLICATIONS  Take your time so you do not get dizzy or light-headed.  If you are in pain, you may need to take or ask for pain medication before doing incentive spirometry. It is harder to take a deep breath if you are having pain. AFTER USE  Rest and breathe slowly and easily.  It can be helpful to keep track of a log of your progress. Your caregiver can provide you with a simple table to help with this. If you are using the spirometer at home, follow these instructions: [place] IF:   You are having difficultly using the spirometer.  You have trouble using the spirometer as often as instructed.  Your pain medication is not giving enough relief while using the spirometer.  You develop fever of 100.5 F (38.1 C) or higher. SEEK IMMEDIATE MEDICAL CARE IF:   You cough up bloody sputum that had not been present before.  You develop fever of 102 F (38.9 C) or greater.  You develop worsening pain at or near the incision site. MAKE SURE YOU:   Understand these instructions.  Will watch your condition.  Will get help right away if you are not doing well or get worse. Document Released: 05/13/2006 Document Revised: 03/25/2011 Document Reviewed: 07/14/2006 [facility] Patient Information 2014 [place], [place].   ________________________________________________________________________

## 2019-11-10 ENCOUNTER — Encounter (HOSPITAL_COMMUNITY)
Admission: RE | Admit: 2019-11-10 | Discharge: 2019-11-10 | Disposition: A | Discharge status: Home / Self Care | Payer: Medicare Other | Source: Ambulatory Visit | Attending: Orthopedic Surgery | Admitting: Orthopedic Surgery

## 2019-11-10 ENCOUNTER — Encounter (HOSPITAL_COMMUNITY): Payer: Self-pay

## 2019-11-10 ENCOUNTER — Encounter (INDEPENDENT_AMBULATORY_CARE_PROVIDER_SITE_OTHER): Payer: Self-pay

## 2019-11-10 ENCOUNTER — Other Ambulatory Visit: Payer: Self-pay

## 2019-11-10 DIAGNOSIS — I10 Essential (primary) hypertension: Secondary | ICD-10-CM | POA: Insufficient documentation

## 2019-11-10 DIAGNOSIS — Z01818 Encounter for other preprocedural examination: Secondary | ICD-10-CM | POA: Insufficient documentation

## 2019-11-10 LAB — SURGICAL PCR SCREEN
MRSA, PCR: NEGATIVE
Staphylococcus aureus: NEGATIVE

## 2019-11-10 NOTE — Progress Notes (Signed)
COVID Vaccine Completed:yes Date COVID Vaccine completed:02/23/19, booster 10/01/19 COVID vaccine manufacturer: Pfizer     PCP - Dr. Clarene Reamer Cardiologist - no  Chest x-ray - no EKG - 11/09/19-Chart Stress Test - no ECHO - no Cardiac Cath - no Pacemaker/ICD device last checked:NA  Sleep Study - yes CPAP - no  Fasting Blood Sugar - NA Checks Blood Sugar _____ times a day  Blood Thinner Instructions:NA Aspirin Instructions: Last Dose:  Anesthesia review:   Patient denies shortness of breath, fever, cough and chest pain at PAT appointment yes   Patient verbalized understanding of instructions that were given to them at the PAT appointment. Patient was also instructed that they will need to review over the PAT instructions again at home before surgery. Yes Pt reports no SOB climbing stairs, working or with ADLs. He said that he hasn't needed a c-pap in 9 years and had surgeries without a problem.

## 2019-11-14 NOTE — H&P (Signed)
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 male, has a history of pain and functional disability in the left hip due to failed previous THA, cemented liner.  Has failed non-surgical conservative treatments for greater than 12 weeks to include NSAID's and/or analgesics, use of assistive devices and activity modification. The indications for the revision total hip arthroplasty are failed cemented liner and instability.  Onset of symptoms was gradual starting  years ago with gradually worsening course since that time.  Prior procedures on the left hip include arthroplasty and revision.  Patient currently rates pain in the left hip at 8 out of 10 with activity.  There is night pain, worsening of pain with activity and weight bearing, trendelenberg gait, pain that interfers with activities of daily living and pain with passive range of motion. Patient has evidence of prosthetic loosening by imaging studies.  This condition presents safety issues increasing the risk of falls.   There is no current active infection.  Risks, benefits and expectations were discussed with the patient.  Risks including but not limited to the risk of anesthesia, blood clots, nerve damage, blood vessel damage, failure of the prosthesis, infection and up to and including death.  Patient understand the risks, benefits and expectations and wishes to proceed with surgery.   PCP: Robyne Peers, MD  D/C Plans:       Home (obs)  Post-op Meds:       No Rx given   Tranexamic Acid:      To be given - IV   Decadron:      Is to be given  FYI:       ASA  Norco  DME:   Pt already has equipment  PT:   [facility]  Pharmacy: [facility] [place]    Patient Active Problem List   Diagnosis Date Noted  . 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 Expectd blood loss anemia 07/21/2012  . Obese 07/21/2012  . S/P left TH revision 07/20/2012   Past Medical History:  Diagnosis Date  . Arthritis   . Cancer [facility]) 2014   prostate cancer  . Complication of anesthesia    low O2 sats, could not wake up after hip surgery  . GERD (gastroesophageal reflux disease)   . Hypercholesteremia   . Hypertension   . Osteoporosis   . Sleep apnea    no CPAP    Past Surgical History:  Procedure Laterality Date  . ACETABULAR REVISION Left 01/01/2014   Procedure: ACETABULAR REVISION;  Surgeon: [name], MD;  Location: [facility] ORS;  Service: Orthopedics;  Laterality: Left;  . APPENDECTOMY    . FOOT SURGERY Bilateral 10 years ago   6 surgeries after crushing both feet  . HIP OPEN REDUCTION Left   . HIP SURGERY Right at 69 years old  . JOINT REPLACEMENT    . PROSTATECTOMY  March 2014  . SHOULDER SURGERY Right at 69 years old  . TOTAL HIP REVISION Left 07/20/2012   Procedure: LEFT TOTAL HIP REVISION;  Surgeon: [name], MD;  Location: [facility] ORS;  Service: Orthopedics;  Laterality: Left;    No current facility-administered medications for this encounter.   Current Outpatient Medications  Medication Sig Dispense Refill Last Dose  . amLODipine (NORVASC) 5  MG tablet Take 5 mg by mouth daily.     Marland Kitchen ascorbic acid (VITAMIN C) 500 MG tablet Take 500 mg by mouth daily.     . Multiple Vitamin (MULTIVITAMIN WITH MINERALS) TABS Take 1 tablet by mouth every evening.      . pantoprazole (PROTONIX) 40 MG tablet Take 40 mg by mouth every morning.      . simvastatin (ZOCOR) 10 MG tablet Take 10 mg by mouth every evening.       No Known Allergies   Social History   Tobacco Use  . Smoking status: Former Smoker    Years: 15.00    Types: Cigarettes    Quit date: 01/14/1978    Years since quitting: 41.8  . Smokeless tobacco: Never Used  Substance Use Topics  . Alcohol use: Yes    Comment: social       Review of  Systems  Constitutional: Negative.   HENT: Negative.   Eyes: Negative.   Respiratory: Negative.   Cardiovascular: Negative.   Gastrointestinal: Negative.   Genitourinary: Negative.   Musculoskeletal: Positive for joint pain.  Skin: Negative.   Neurological: Negative.   Endo/Heme/Allergies: Negative.   Psychiatric/Behavioral: Negative.       Objective:  Physical Exam Constitutional:      Appearance: He is well-developed.  HENT:     Head: Normocephalic.  Eyes:     Pupils: Pupils are equal, round, and reactive to light.  Neck:     Thyroid: No thyromegaly.     Vascular: No JVD.     Trachea: No tracheal deviation.  Cardiovascular:     Rate and Rhythm: Normal rate and regular rhythm.  Pulmonary:     Effort: Pulmonary effort is normal. No respiratory distress.     Breath sounds: Normal breath sounds. No wheezing.  Abdominal:     Palpations: Abdomen is soft.     Tenderness: There is no abdominal tenderness. There is no guarding.  Musculoskeletal:     Cervical back: Neck supple.     Left hip: Tenderness and bony tenderness present. No deformity. Decreased range of motion. Decreased strength.  Lymphadenopathy:     Cervical: No cervical adenopathy.  Skin:    General: Skin is warm and dry.  Neurological:     Mental Status: He is alert and oriented to person, place, and time.     Sensory: Sensory deficit (left foot numbness and slight foot drop) present.       Labs:  Estimated body mass index is 38.98 kg/m as calculated from the following:   Height as of 11/10/19: 6\' 1"  (1.854 m).   Weight as of 11/10/19: 134 kg.  Imaging Review:  Plain radiographs demonstrate failure of the left hip cemented liner. There is evidence of loosening of the liner.The bone quality appears to be good for age and reported activity level.     Assessment/Plan:  Left hip with failed previous arthroplasty, cemented liner  The patient history, physical examination, clinical judgement of  the provider and imaging studies are consistent with failure of the left hip(s), previous total hip arthroplasty, cemented liner. Revision total hip arthroplasty is deemed medically necessary. The treatment options including medical management, injection therapy, arthroscopy and arthroplasty were discussed at length. The risks and benefits of total hip arthroplasty were presented and reviewed. The risks due to aseptic loosening, infection, stiffness, dislocation/subluxation,  thromboembolic complications and other imponderables were discussed.  The patient acknowledged the explanation, agreed to proceed with the plan and consent was  signed. Patient is being admitted for inpatient treatment for surgery, pain control, PT, OT, prophylactic antibiotics, VTE prophylaxis, progressive ambulation and ADL's and discharge planning. The patient is planning to be discharged home .    West Pugh Dequita Schleicher   PA-C  11/14/2019, 4:04 PM

## 2019-11-15 ENCOUNTER — Other Ambulatory Visit (HOSPITAL_COMMUNITY)
Admission: RE | Admit: 2019-11-15 | Discharge: 2019-11-15 | Disposition: A | Discharge status: Home / Self Care | Payer: Medicare Other | Source: Ambulatory Visit | Attending: Orthopedic Surgery | Admitting: Orthopedic Surgery

## 2019-11-15 DIAGNOSIS — Z20822 Contact with and (suspected) exposure to covid-19: Secondary | ICD-10-CM | POA: Insufficient documentation

## 2019-11-15 DIAGNOSIS — Z01818 Encounter for other preprocedural examination: Secondary | ICD-10-CM | POA: Diagnosis present

## 2019-11-15 LAB — SARS CORONAVIRUS 2 (TAT 6-24 HRS): SARS Coronavirus 2: NEGATIVE

## 2019-11-17 MED ORDER — DEXTROSE 5 % IV SOLN
3.0000 g | INTRAVENOUS | Status: AC
  Administered 2019-11-18: 3 g via INTRAVENOUS
  Filled 2019-11-17: qty 3

## 2019-11-18 ENCOUNTER — Observation Stay (HOSPITAL_COMMUNITY)
Admission: RE | Admit: 2019-11-18 | Discharge: 2019-11-19 | Disposition: A | Discharge status: Home / Self Care | Payer: Medicare Other | Source: Ambulatory Visit | Attending: Orthopedic Surgery | Admitting: Orthopedic Surgery

## 2019-11-18 ENCOUNTER — Other Ambulatory Visit: Payer: Self-pay

## 2019-11-18 ENCOUNTER — Encounter (HOSPITAL_COMMUNITY): Payer: Self-pay | Admitting: Orthopedic Surgery

## 2019-11-18 ENCOUNTER — Encounter (HOSPITAL_COMMUNITY)
Admission: RE | Disposition: A | Discharge status: Home / Self Care | Payer: Self-pay | Source: Ambulatory Visit | Attending: Orthopedic Surgery

## 2019-11-18 ENCOUNTER — Ambulatory Visit (HOSPITAL_COMMUNITY): Payer: Medicare Other | Admitting: Certified Registered"

## 2019-11-18 ENCOUNTER — Observation Stay (HOSPITAL_COMMUNITY): Payer: Medicare Other

## 2019-11-18 DIAGNOSIS — Z8546 Personal history of malignant neoplasm of prostate: Secondary | ICD-10-CM | POA: Insufficient documentation

## 2019-11-18 DIAGNOSIS — M25552 Pain in left hip: Secondary | ICD-10-CM | POA: Diagnosis present

## 2019-11-18 DIAGNOSIS — Z79899 Other long term (current) drug therapy: Secondary | ICD-10-CM | POA: Insufficient documentation

## 2019-11-18 DIAGNOSIS — Z87891 Personal history of nicotine dependence: Secondary | ICD-10-CM | POA: Diagnosis not present

## 2019-11-18 DIAGNOSIS — T84091A Other mechanical complication of internal left hip prosthesis, initial encounter: Principal | ICD-10-CM | POA: Insufficient documentation

## 2019-11-18 DIAGNOSIS — Z96649 Presence of unspecified artificial hip joint: Secondary | ICD-10-CM

## 2019-11-18 DIAGNOSIS — I10 Essential (primary) hypertension: Secondary | ICD-10-CM | POA: Insufficient documentation

## 2019-11-18 HISTORY — PX: TOTAL HIP REVISION: SHX763

## 2019-11-18 LAB — TYPE AND SCREEN
ABO/RH(D): A POS
Antibody Screen: NEGATIVE

## 2019-11-18 SURGERY — TOTAL HIP REVISION
Anesthesia: Spinal | Site: Hip | Laterality: Left

## 2019-11-18 MED ORDER — SODIUM CHLORIDE 0.9 % IV SOLN: INTRAVENOUS | Status: DC

## 2019-11-18 MED ORDER — DIPHENHYDRAMINE HCL 12.5 MG/5ML PO ELIX: 12.5000 mg | ORAL_SOLUTION | ORAL | Status: DC | PRN

## 2019-11-18 MED ORDER — METOCLOPRAMIDE HCL 5 MG PO TABS: 5.0000 mg | ORAL_TABLET | Freq: Three times a day (TID) | ORAL | Status: DC | PRN

## 2019-11-18 MED ORDER — PHENYLEPHRINE 40 MCG/ML (10ML) SYRINGE FOR IV PUSH (FOR BLOOD PRESSURE SUPPORT)
PREFILLED_SYRINGE | INTRAVENOUS | Status: DC | PRN
  Administered 2019-11-18 (×3): 40 ug via INTRAVENOUS

## 2019-11-18 MED ORDER — SIMVASTATIN 10 MG PO TABS
10.0000 mg | ORAL_TABLET | Freq: Every evening | ORAL | Status: DC
  Administered 2019-11-18: 10 mg via ORAL
  Filled 2019-11-18: qty 1

## 2019-11-18 MED ORDER — ASPIRIN 81 MG PO CHEW
81.0000 mg | CHEWABLE_TABLET | Freq: Two times a day (BID) | ORAL | Status: DC
  Administered 2019-11-18 – 2019-11-19 (×2): 81 mg via ORAL
  Filled 2019-11-18 (×2): qty 1

## 2019-11-18 MED ORDER — CHLORHEXIDINE GLUCONATE 0.12 % MT SOLN
15.0000 mL | Freq: Once | OROMUCOSAL | Status: AC
  Administered 2019-11-18: 15 mL via OROMUCOSAL

## 2019-11-18 MED ORDER — METHOCARBAMOL 500 MG IVPB - SIMPLE MED
500.0000 mg | Freq: Four times a day (QID) | INTRAVENOUS | Status: DC | PRN
  Filled 2019-11-18: qty 50

## 2019-11-18 MED ORDER — FERROUS SULFATE 325 (65 FE) MG PO TABS
325.0000 mg | ORAL_TABLET | Freq: Three times a day (TID) | ORAL | Status: DC
  Administered 2019-11-19: 325 mg via ORAL
  Filled 2019-11-18: qty 1

## 2019-11-18 MED ORDER — METOCLOPRAMIDE HCL 5 MG/ML IJ SOLN: 5.0000 mg | Freq: Three times a day (TID) | INTRAMUSCULAR | Status: DC | PRN

## 2019-11-18 MED ORDER — POLYETHYLENE GLYCOL 3350 17 G PO PACK
17.0000 g | PACK | Freq: Two times a day (BID) | ORAL | Status: DC
  Administered 2019-11-18: 17 g via ORAL
  Filled 2019-11-18 (×2): qty 1

## 2019-11-18 MED ORDER — TRANEXAMIC ACID-NACL 1000-0.7 MG/100ML-% IV SOLN
1000.0000 mg | INTRAVENOUS | Status: AC
  Administered 2019-11-18: 1000 mg via INTRAVENOUS

## 2019-11-18 MED ORDER — ACETAMINOPHEN 325 MG PO TABS: 325.0000 mg | ORAL_TABLET | Freq: Four times a day (QID) | ORAL | Status: DC | PRN

## 2019-11-18 MED ORDER — METHOCARBAMOL 500 MG PO TABS: 500.0000 mg | ORAL_TABLET | Freq: Four times a day (QID) | ORAL | Status: DC | PRN

## 2019-11-18 MED ORDER — DEXAMETHASONE SODIUM PHOSPHATE 10 MG/ML IJ SOLN
10.0000 mg | Freq: Once | INTRAMUSCULAR | Status: AC
  Administered 2019-11-18: 10 mg via INTRAVENOUS

## 2019-11-18 MED ORDER — PROPOFOL 10 MG/ML IV BOLUS
INTRAVENOUS | Status: DC | PRN
  Administered 2019-11-18 (×2): 20 mg via INTRAVENOUS
  Administered 2019-11-18: 25 mg via INTRAVENOUS
  Administered 2019-11-18: 20 mg via INTRAVENOUS

## 2019-11-18 MED ORDER — TRANEXAMIC ACID-NACL 1000-0.7 MG/100ML-% IV SOLN
INTRAVENOUS | Status: AC
  Filled 2019-11-18: qty 100

## 2019-11-18 MED ORDER — PROPOFOL 500 MG/50ML IV EMUL
INTRAVENOUS | Status: AC
  Filled 2019-11-18: qty 50

## 2019-11-18 MED ORDER — SODIUM CHLORIDE 0.9 % IR SOLN
Status: DC | PRN
  Administered 2019-11-18: 3000 mL

## 2019-11-18 MED ORDER — FENTANYL CITRATE (PF) 100 MCG/2ML IJ SOLN
INTRAMUSCULAR | Status: AC
  Filled 2019-11-18: qty 2

## 2019-11-18 MED ORDER — MEPERIDINE HCL 50 MG/ML IJ SOLN: 6.2500 mg | INTRAMUSCULAR | Status: DC | PRN

## 2019-11-18 MED ORDER — DEXAMETHASONE SODIUM PHOSPHATE 10 MG/ML IJ SOLN
10.0000 mg | Freq: Once | INTRAMUSCULAR | Status: AC
  Administered 2019-11-19: 10 mg via INTRAVENOUS
  Filled 2019-11-18: qty 1

## 2019-11-18 MED ORDER — AMLODIPINE BESYLATE 5 MG PO TABS
5.0000 mg | ORAL_TABLET | Freq: Every day | ORAL | Status: DC
  Administered 2019-11-18 – 2019-11-19 (×2): 5 mg via ORAL
  Filled 2019-11-18 (×2): qty 1

## 2019-11-18 MED ORDER — HYDROMORPHONE HCL 1 MG/ML IJ SOLN: 0.5000 mg | INTRAMUSCULAR | Status: DC | PRN

## 2019-11-18 MED ORDER — MAGNESIUM CITRATE PO SOLN: 1.0000 | Freq: Once | ORAL | Status: DC | PRN

## 2019-11-18 MED ORDER — PHENYLEPHRINE 40 MCG/ML (10ML) SYRINGE FOR IV PUSH (FOR BLOOD PRESSURE SUPPORT)
PREFILLED_SYRINGE | INTRAVENOUS | Status: AC
  Filled 2019-11-18: qty 10

## 2019-11-18 MED ORDER — ALUM & MAG HYDROXIDE-SIMETH 200-200-20 MG/5ML PO SUSP: 15.0000 mL | ORAL | Status: DC | PRN

## 2019-11-18 MED ORDER — PROPOFOL 10 MG/ML IV BOLUS
INTRAVENOUS | Status: AC
  Filled 2019-11-18: qty 20

## 2019-11-18 MED ORDER — TRANEXAMIC ACID-NACL 1000-0.7 MG/100ML-% IV SOLN
1000.0000 mg | Freq: Once | INTRAVENOUS | Status: AC
  Administered 2019-11-18: 1000 mg via INTRAVENOUS
  Filled 2019-11-18: qty 100

## 2019-11-18 MED ORDER — ORAL CARE MOUTH RINSE: 15.0000 mL | Freq: Once | OROMUCOSAL | Status: AC

## 2019-11-18 MED ORDER — LACTATED RINGERS IV SOLN: INTRAVENOUS | Status: DC

## 2019-11-18 MED ORDER — BISACODYL 10 MG RE SUPP: 10.0000 mg | Freq: Every day | RECTAL | Status: DC | PRN

## 2019-11-18 MED ORDER — BUPIVACAINE HCL (PF) 0.5 % IJ SOLN
INTRAMUSCULAR | Status: AC
  Filled 2019-11-18: qty 30

## 2019-11-18 MED ORDER — HYDROMORPHONE HCL 1 MG/ML IJ SOLN: 0.2500 mg | INTRAMUSCULAR | Status: DC | PRN

## 2019-11-18 MED ORDER — HYDROCODONE-ACETAMINOPHEN 7.5-325 MG PO TABS
1.0000 | ORAL_TABLET | ORAL | Status: DC | PRN
  Filled 2019-11-18: qty 2

## 2019-11-18 MED ORDER — PANTOPRAZOLE SODIUM 40 MG PO TBEC
40.0000 mg | DELAYED_RELEASE_TABLET | Freq: Every morning | ORAL | Status: DC
  Administered 2019-11-19: 40 mg via ORAL
  Filled 2019-11-18: qty 1

## 2019-11-18 MED ORDER — HYDROCODONE-ACETAMINOPHEN 5-325 MG PO TABS: 1.0000 | ORAL_TABLET | ORAL | Status: DC | PRN

## 2019-11-18 MED ORDER — BUPIVACAINE HCL (PF) 0.5 % IJ SOLN
INTRAMUSCULAR | Status: DC | PRN
  Administered 2019-11-18: 3 mL via INTRATHECAL

## 2019-11-18 MED ORDER — EPHEDRINE SULFATE-NACL 50-0.9 MG/10ML-% IV SOSY
PREFILLED_SYRINGE | INTRAVENOUS | Status: DC | PRN
  Administered 2019-11-18 (×4): 5 mg via INTRAVENOUS
  Administered 2019-11-18 (×2): 10 mg via INTRAVENOUS

## 2019-11-18 MED ORDER — ONDANSETRON HCL 4 MG/2ML IJ SOLN: 4.0000 mg | Freq: Four times a day (QID) | INTRAMUSCULAR | Status: DC | PRN

## 2019-11-18 MED ORDER — PROPOFOL 500 MG/50ML IV EMUL
INTRAVENOUS | Status: DC | PRN
  Administered 2019-11-18: 75 ug/kg/min via INTRAVENOUS

## 2019-11-18 MED ORDER — CELECOXIB 200 MG PO CAPS
200.0000 mg | ORAL_CAPSULE | Freq: Two times a day (BID) | ORAL | Status: DC
  Administered 2019-11-18 – 2019-11-19 (×2): 200 mg via ORAL
  Filled 2019-11-18 (×2): qty 1

## 2019-11-18 MED ORDER — FENTANYL CITRATE (PF) 100 MCG/2ML IJ SOLN
INTRAMUSCULAR | Status: DC | PRN
  Administered 2019-11-18: 50 ug via INTRAVENOUS

## 2019-11-18 MED ORDER — BUPIVACAINE HCL (PF) 0.5 % IJ SOLN
INTRAMUSCULAR | Status: DC | PRN
  Administered 2019-11-18: 3 mL

## 2019-11-18 MED ORDER — MENTHOL 3 MG MT LOZG: 1.0000 | LOZENGE | OROMUCOSAL | Status: DC | PRN

## 2019-11-18 MED ORDER — ONDANSETRON HCL 4 MG/2ML IJ SOLN: 4.0000 mg | Freq: Once | INTRAMUSCULAR | Status: DC | PRN

## 2019-11-18 MED ORDER — CEFAZOLIN SODIUM-DEXTROSE 2-4 GM/100ML-% IV SOLN
2.0000 g | Freq: Four times a day (QID) | INTRAVENOUS | Status: AC
  Administered 2019-11-18 – 2019-11-19 (×2): 2 g via INTRAVENOUS
  Filled 2019-11-18 (×2): qty 100

## 2019-11-18 MED ORDER — MIDAZOLAM HCL 5 MG/5ML IJ SOLN
INTRAMUSCULAR | Status: DC | PRN
  Administered 2019-11-18: 2 mg via INTRAVENOUS

## 2019-11-18 MED ORDER — DOCUSATE SODIUM 100 MG PO CAPS
100.0000 mg | ORAL_CAPSULE | Freq: Two times a day (BID) | ORAL | Status: DC
  Administered 2019-11-18 – 2019-11-19 (×2): 100 mg via ORAL
  Filled 2019-11-18 (×2): qty 1

## 2019-11-18 MED ORDER — MIDAZOLAM HCL 2 MG/2ML IJ SOLN
INTRAMUSCULAR | Status: AC
  Filled 2019-11-18: qty 2

## 2019-11-18 MED ORDER — ONDANSETRON HCL 4 MG PO TABS: 4.0000 mg | ORAL_TABLET | Freq: Four times a day (QID) | ORAL | Status: DC | PRN

## 2019-11-18 MED ORDER — PHENOL 1.4 % MT LIQD: 1.0000 | OROMUCOSAL | Status: DC | PRN

## 2019-11-18 SURGICAL SUPPLY — 62 items
ADH SKN CLS APL DERMABOND .7 (GAUZE/BANDAGES/DRESSINGS) ×2
BAG DECANTER FOR FLEXI CONT (MISCELLANEOUS) ×1 IMPLANT
BAG SPEC THK2 15X12 ZIP CLS (MISCELLANEOUS)
BAG ZIPLOCK 12X15 (MISCELLANEOUS) ×1 IMPLANT
BLADE SAW SGTL 18X1.27X75 (BLADE) ×1 IMPLANT
BLADE SAW SGTL 18X1.27X75MM (BLADE) ×1
BOWL SMART MIX CTS (DISPOSABLE) ×2 IMPLANT
BRUSH FEMORAL CANAL (MISCELLANEOUS) IMPLANT
BUR OVAL CARBIDE 4.0 (BURR) ×2 IMPLANT
CEMENT HV SMART SET (Cement) ×2 IMPLANT
COVER SURGICAL LIGHT HANDLE (MISCELLANEOUS) ×3 IMPLANT
COVER WAND RF STERILE (DRAPES) IMPLANT
DERMABOND ADVANCED (GAUZE/BANDAGES/DRESSINGS) ×4
DERMABOND ADVANCED .7 DNX12 (GAUZE/BANDAGES/DRESSINGS) ×1 IMPLANT
DRAPE INCISE IOBAN 85X60 (DRAPES) ×3 IMPLANT
DRAPE ORTHO SPLIT 77X108 STRL (DRAPES) ×6
DRAPE POUCH INSTRU U-SHP 10X18 (DRAPES) ×3 IMPLANT
DRAPE SURG 17X11 SM STRL (DRAPES) ×3 IMPLANT
DRAPE SURG ORHT 6 SPLT 77X108 (DRAPES) ×2 IMPLANT
DRAPE U-SHAPE 47X51 STRL (DRAPES) ×3 IMPLANT
DRSG AQUACEL AG ADV 3.5X10 (GAUZE/BANDAGES/DRESSINGS) IMPLANT
DRSG AQUACEL AG ADV 3.5X14 (GAUZE/BANDAGES/DRESSINGS) ×2 IMPLANT
DURAPREP 26ML APPLICATOR (WOUND CARE) ×5 IMPLANT
ELECT BLADE TIP CTD 4 INCH (ELECTRODE) ×3 IMPLANT
ELECT REM PT RETURN 15FT ADLT (MISCELLANEOUS) ×3 IMPLANT
FACESHIELD WRAPAROUND (MASK) ×12 IMPLANT
FACESHIELD WRAPAROUND OR TEAM (MASK) ×4 IMPLANT
GLOVE BIO SURGEON STRL SZ 6.5 (GLOVE) ×4 IMPLANT
GLOVE BIO SURGEONS STRL SZ 6.5 (GLOVE) ×2
GLOVE BIOGEL PI IND STRL 7.5 (GLOVE) ×1 IMPLANT
GLOVE BIOGEL PI IND STRL 8.5 (GLOVE) ×1 IMPLANT
GLOVE BIOGEL PI INDICATOR 7.5 (GLOVE) ×16
GLOVE BIOGEL PI INDICATOR 8.5 (GLOVE) ×2
GLOVE ECLIPSE 8.0 STRL XLNG CF (GLOVE) ×6 IMPLANT
GLOVE INDICATOR 6.5 STRL GRN (GLOVE) ×3 IMPLANT
GOWN STRL REUS W/TWL 2XL LVL3 (GOWN DISPOSABLE) ×3 IMPLANT
GOWN STRL REUS W/TWL LRG LVL3 (GOWN DISPOSABLE) ×8 IMPLANT
HANDPIECE INTERPULSE COAX TIP (DISPOSABLE) ×3
KIT BASIN OR (CUSTOM PROCEDURE TRAY) ×3 IMPLANT
KIT TURNOVER KIT A (KITS) ×2 IMPLANT
LINER NEUTRAL 52X36X52 PLUS 4 (Liner) ×2 IMPLANT
MANIFOLD NEPTUNE II (INSTRUMENTS) ×3 IMPLANT
NDL SAFETY ECLIPSE 18X1.5 (NEEDLE) ×1 IMPLANT
NEEDLE HYPO 18GX1.5 SHARP (NEEDLE) ×3
NS IRRIG 1000ML POUR BTL (IV SOLUTION) ×3 IMPLANT
PACK TOTAL JOINT (CUSTOM PROCEDURE TRAY) ×3 IMPLANT
PENCIL SMOKE EVACUATOR (MISCELLANEOUS) ×1 IMPLANT
PROTECTOR NERVE ULNAR (MISCELLANEOUS) ×5 IMPLANT
SET HNDPC FAN SPRY TIP SCT (DISPOSABLE) ×1 IMPLANT
SPONGE LAP 18X18 RF (DISPOSABLE) IMPLANT
STAPLER VISISTAT 35W (STAPLE) IMPLANT
SUCTION FRAZIER HANDLE 12FR (TUBING)
SUCTION TUBE FRAZIER 12FR DISP (TUBING) ×1 IMPLANT
SUT MNCRL AB 4-0 PS2 18 (SUTURE) ×2 IMPLANT
SUT STRATAFIX PDS+ 0 24IN (SUTURE) ×3 IMPLANT
SUT VIC AB 1 CT1 36 (SUTURE) ×5 IMPLANT
SUT VIC AB 2-0 CT1 27 (SUTURE) ×9
SUT VIC AB 2-0 CT1 TAPERPNT 27 (SUTURE) ×2 IMPLANT
TOWEL OR 17X26 10 PK STRL BLUE (TOWEL DISPOSABLE) ×6 IMPLANT
TRAY FOLEY MTR SLVR 16FR STAT (SET/KITS/TRAYS/PACK) ×3 IMPLANT
TUBE SUCTION HIGH CAP CLEAR NV (SUCTIONS) ×2 IMPLANT
WATER STERILE IRR 1000ML POUR (IV SOLUTION) ×6 IMPLANT

## 2019-11-18 NOTE — Anesthesia Preprocedure Evaluation (Signed)
Anesthesia Evaluation  Patient identified by MRN, date of birth, ID band Patient awake    Reviewed: Allergy & Precautions, NPO status , Patient's Chart, lab work & pertinent test results  Airway Mallampati: I  TM Distance: >3 FB Neck ROM: Full    Dental   Pulmonary sleep apnea , former smoker,    Pulmonary exam normal        Cardiovascular hypertension, Pt. on medications Normal cardiovascular exam     Neuro/Psych    GI/Hepatic GERD  Medicated and Controlled,  Endo/Other    Renal/GU      Musculoskeletal   Abdominal   Peds  Hematology   Anesthesia Other Findings   Reproductive/Obstetrics                             Anesthesia Physical Anesthesia Plan  ASA: II  Anesthesia Plan: Spinal   Post-op Pain Management:    Induction: Intravenous  PONV Risk Score and Plan: 1 and Ondansetron  Airway Management Planned: Nasal Cannula  Additional Equipment:   Intra-op Plan:   Post-operative Plan:   Informed Consent: I have reviewed the patients History and Physical, chart, labs and discussed the procedure including the risks, benefits and alternatives for the proposed anesthesia with the patient or authorized representative who has indicated his/her understanding and acceptance.       Plan Discussed with: CRNA and Surgeon  Anesthesia Plan Comments:         Anesthesia Quick Evaluation

## 2019-11-18 NOTE — Anesthesia Postprocedure Evaluation (Signed)
Anesthesia Post Note  Patient: Gregory Norton  Procedure(s) Performed: POSTERIOR TOTAL HIP REVISION (Left Hip)     Patient location during evaluation: PACU Anesthesia Type: Spinal Level of consciousness: oriented and awake and alert Pain management: pain level controlled Vital Signs Assessment: post-procedure vital signs reviewed and stable Respiratory status: spontaneous breathing, respiratory function stable and patient connected to nasal cannula oxygen Cardiovascular status: blood pressure returned to baseline and stable Postop Assessment: no headache, no backache and no apparent nausea or vomiting Anesthetic complications: no   No complications documented.  Last Vitals:  Vitals:   11/18/19 1500 11/18/19 1515  BP: (!) 154/87 (!) 145/93  Pulse: 61 (!) 57  Resp: 13 13  Temp:    SpO2: 100% 100%    Last Pain:  Vitals:   11/18/19 1500  TempSrc:   PainSc: 0-No pain                 Niomie Englert DAVID

## 2019-11-18 NOTE — Interval H&P Note (Signed)
History and Physical Interval Note:  11/18/2019 10:36 AM  Gregory Norton  has presented today for surgery, with the diagnosis of Failed left total hip revision.  The various methods of treatment have been discussed with the patient and family. After consideration of risks, benefits and other options for treatment, the patient has consented to  Procedure(s) with comments: POSTERIOR TOTAL HIP REVISION (Left) - 90 mins as a surgical intervention.  The patient's history has been reviewed, patient examined, no change in status, stable for surgery.  I have reviewed the patient's chart and labs.  Questions were answered to the patient's satisfaction.     [name]

## 2019-11-18 NOTE — Anesthesia Procedure Notes (Signed)
Spinal  Start time: 11/18/2019 12:09 PM End time: 11/18/2019 12:12 PM Staffing Performed: anesthesiologist  Anesthesiologist: Lillia Abed, MD Preanesthetic Checklist Completed: patient identified, IV checked, risks and benefits discussed, surgical consent, monitors and equipment checked, pre-op evaluation and timeout performed Spinal Block Patient position: sitting Prep: DuraPrep Patient monitoring: blood pressure, continuous pulse ox, cardiac monitor and heart rate Approach: right paramedian Location: L3-4 Injection technique: single-shot Needle Needle type: Pencan  Needle gauge: 24 G Needle length: 9 cm Needle insertion depth: 9 cm

## 2019-11-18 NOTE — Evaluation (Signed)
Physical Therapy Evaluation Patient Details Name: Gregory Norton MRN: [id number] DOB: 1950-08-08 Today's Date: 11/18/2019   History of Present Illness  Patient is 69 y.o. male s/p Lt THR (posterior) on 11/18/19 with PMH significant for HTN, prostate cancer, OA, GERD, osteoporosis, previous Lt THR in 2014.    Clinical Impression  Gregory Norton is a 69 y.o. male POD 0 s/p Lt THR. Patient reports independence with mobility at baseline use of SPC ion last 2 months to prevent dislocation. Patient is now limited by functional impairments (see PT problem list below) and requires min assist for transfers and gait with RW. Patient was able to ambulate ~90 feet with RW and min assist/guard. Patient instructed in exercise to facilitate strength and circulation and educated on posterior hip precautions and weight bearing restrictions. Patient will benefit from continued skilled PT interventions to address impairments and progress towards PLOF. Acute PT will follow to progress mobility and stair training in preparation for safe discharge home.     Follow Up Recommendations Follow surgeon's recommendation for DC plan and follow-up therapies;Outpatient PT    Equipment Recommendations  None recommended by PT    Recommendations for Other Services       Precautions / Restrictions Precautions Precautions: Fall Restrictions Weight Bearing Restrictions: Yes LLE Weight Bearing: Partial weight bearing LLE Partial Weight Bearing Percentage or Pounds: 50%      Mobility  Bed Mobility Overal bed mobility: Needs Assistance Bed Mobility: Supine to Sit     Supine to sit: Min assist;HOB elevated     General bed mobility comments: Light min assist for Lt LE mobility to EOB. pt able to scoot forward and place feet on floor.    Transfers Overall transfer level: Needs assistance Equipment used: Rolling walker (2 wheeled) Transfers: Sit to/from Stand Sit to Stand: Min assist;Min guard;From elevated surface          General transfer comment: cues for safe hand placement on RW, light assist for power up and to steady in standing.   Ambulation/Gait Ambulation/Gait assistance: Min assist Gait Distance (Feet): 90 Feet Assistive device: Rolling walker (2 wheeled) Gait Pattern/deviations: Step-through pattern;Decreased stride length;Decreased weight shift to left Gait velocity: decr   General Gait Details: cues for safe step pattern and proximity to RW, pt preferring step through pattern. cues to maintain PWB status of 50% or less on Lt LE. no overt LOB noted.  Stairs            Wheelchair Mobility    Modified Rankin (Stroke Patients Only)       Balance Overall balance assessment: Needs assistance;Mild deficits observed, not formally tested Sitting-balance support: Feet supported Sitting balance-Leahy Scale: Good     Standing balance support: During functional activity;Bilateral upper extremity supported Standing balance-Leahy Scale: Fair                               Pertinent Vitals/Pain Pain Assessment: 0-10 Pain Score: 2  Pain Location: Lt hip Pain Descriptors / Indicators: Aching;Discomfort;Sore Pain Intervention(s): Limited activity within patient's tolerance;Monitored during session;Repositioned;Ice applied    Home Living Family/patient expects to be discharged to:: Private residence Living Arrangements: Spouse/significant other Available Help at Discharge: Family Type of Home: House Home Access: Level entry (small step up through door 2")     Home Layout: One level Home Equipment: Grab bars - toilet;Grab bars - tub/shower;Walker - 2 wheels;Cane - single point;Crutches;Shower seat;Bedside commode  Prior Function Level of Independence: Independent;Independent with assistive device(s)         Comments: using SPC for last 2 months due to hip dislocations     Hand Dominance   Dominant Hand: Right    Extremity/Trunk Assessment   Upper  Extremity Assessment Upper Extremity Assessment: Overall WFL for tasks assessed    Lower Extremity Assessment Lower Extremity Assessment: Overall WFL for tasks assessed    Cervical / Trunk Assessment Cervical / Trunk Assessment: Normal  Communication   Communication: No difficulties  Cognition Arousal/Alertness: Awake/alert Behavior During Therapy: WFL for tasks assessed/performed Overall Cognitive Status: Within Functional Limits for tasks assessed                                        General Comments      Exercises Total Joint Exercises Ankle Circles/Pumps: AROM;Both;20 reps;Seated Quad Sets: AROM;Left;10 reps;Seated   Assessment/Plan    PT Assessment Patient needs continued PT services  PT Problem List Decreased strength;Decreased range of motion;Decreased activity tolerance;Decreased balance;Decreased mobility;Decreased knowledge of use of DME;Decreased safety awareness;Decreased knowledge of precautions       PT Treatment Interventions DME instruction;Gait training;Therapeutic activities;Functional mobility training;Stair training;Therapeutic exercise;Balance training;Patient/family education    PT Goals (Current goals can be found in the Care Plan section)  Acute Rehab PT Goals Patient Stated Goal: get back to water aerobics and golf PT Goal Formulation: With patient Time For Goal Achievement: 11/25/19 Potential to Achieve Goals: Good    Frequency 7X/week   Barriers to discharge        Co-evaluation               AM-PAC PT "6 Clicks" Mobility  Outcome Measure Help needed turning from your back to your side while in a flat bed without using bedrails?: A Little Help needed moving from lying on your back to sitting on the side of a flat bed without using bedrails?: A Little Help needed moving to and from a bed to a chair (including a wheelchair)?: A Little Help needed standing up from a chair using your arms (e.g., wheelchair or  bedside chair)?: A Little Help needed to walk in hospital room?: A Little Help needed climbing 3-5 steps with a railing? : A Little 6 Click Score: 18    End of Session Equipment Utilized During Treatment: Gait belt Activity Tolerance: Patient tolerated treatment well Patient left: in chair;with call bell/phone within reach;with chair alarm set Nurse Communication: Mobility status PT Visit Diagnosis: Difficulty in walking, not elsewhere classified (R26.2);Muscle weakness (generalized) (M62.81)    Time: 1194-1740 PT Time Calculation (min) (ACUTE ONLY): 27 min   Charges:   PT Evaluation $PT Eval Low Complexity: 1 Low PT Treatments $Gait Training: 8-22 mins      Verner Mould, DPT Acute Rehabilitation Services  Office [phone] Pager [phone]  11/18/2019 6:51 PM

## 2019-11-18 NOTE — Transfer of Care (Signed)
Immediate Anesthesia Transfer of Care Note  Patient: Gregory Norton  Procedure(s) Performed: POSTERIOR TOTAL HIP REVISION (Left Hip)  Patient Location: PACU  Anesthesia Type:MAC and Spinal  Level of Consciousness: awake, alert  and oriented  Airway & Oxygen Therapy: Patient Spontanous Breathing and Patient connected to face mask oxygen  Post-op Assessment: Report given to RN and Post -op Vital signs reviewed and stable  Post vital signs: Reviewed and stable  Last Vitals:  Vitals Value Taken Time  BP 134/87 11/18/19 1432  Temp    Pulse 64 11/18/19 1436  Resp 14 11/18/19 1435  SpO2 100 % 11/18/19 1436  Vitals shown include unvalidated device data.  Last Pain:  Vitals:   11/18/19 0925  TempSrc: Oral      Patients Stated Pain Goal: 5 (15/40/08 6761)  Complications: No complications documented.

## 2019-11-18 NOTE — Progress Notes (Signed)
Orthopedic Tech Progress Note Patient Details:  Gregory Norton 05-Mar-1950 [id number]  Ortho Devices Ortho Device/Splint Location: Patients  bed will not accommodate over bed frame       [place] Pink 11/18/2019, 4:40 PM

## 2019-11-19 ENCOUNTER — Encounter (HOSPITAL_COMMUNITY): Payer: Self-pay | Admitting: Orthopedic Surgery

## 2019-11-19 DIAGNOSIS — T84091A Other mechanical complication of internal left hip prosthesis, initial encounter: Secondary | ICD-10-CM | POA: Diagnosis not present

## 2019-11-19 LAB — CBC
HCT: 42.5 % (ref 39.0–52.0)
Hemoglobin: 14.5 g/dL (ref 13.0–17.0)
MCH: 31.3 pg (ref 26.0–34.0)
MCHC: 34.1 g/dL (ref 30.0–36.0)
MCV: 91.6 fL (ref 80.0–100.0)
Platelets: 240 10*3/uL (ref 150–400)
RBC: 4.64 MIL/uL (ref 4.22–5.81)
RDW: 12.5 % (ref 11.5–15.5)
WBC: 15.4 10*3/uL — ABNORMAL HIGH (ref 4.0–10.5)
nRBC: 0 % (ref 0.0–0.2)

## 2019-11-19 LAB — BASIC METABOLIC PANEL
Anion gap: 11 (ref 5–15)
BUN: 15 mg/dL (ref 8–23)
CO2: 25 mmol/L (ref 22–32)
Calcium: 8.8 mg/dL — ABNORMAL LOW (ref 8.9–10.3)
Chloride: 101 mmol/L (ref 98–111)
Creatinine, Ser: 0.9 mg/dL (ref 0.61–1.24)
GFR, Estimated: >60 mL/min (ref >60)
Glucose, Bld: 166 mg/dL — ABNORMAL HIGH (ref 70–99)
Potassium: 3.6 mmol/L (ref 3.5–5.1)
Sodium: 137 mmol/L (ref 135–145)

## 2019-11-19 MED ORDER — ASPIRIN 81 MG PO CHEW: 81.0000 mg | CHEWABLE_TABLET | Freq: Two times a day (BID) | ORAL | 0 refills | Status: AC

## 2019-11-19 MED ORDER — HYDROCODONE-ACETAMINOPHEN 5-325 MG PO TABS
1.0000 | ORAL_TABLET | Freq: Four times a day (QID) | ORAL | 0 refills | Status: DC | PRN
Start: 2019-11-19 — End: 2020-08-17

## 2019-11-19 MED ORDER — DOCUSATE SODIUM 100 MG PO CAPS: 100.0000 mg | ORAL_CAPSULE | Freq: Two times a day (BID) | ORAL | 0 refills | Status: DC

## 2019-11-19 MED ORDER — FERROUS SULFATE 325 (65 FE) MG PO TABS: 325.0000 mg | ORAL_TABLET | Freq: Three times a day (TID) | ORAL | 0 refills | Status: DC

## 2019-11-19 MED ORDER — METHOCARBAMOL 500 MG PO TABS: 500.0000 mg | ORAL_TABLET | Freq: Four times a day (QID) | ORAL | 0 refills | Status: DC | PRN

## 2019-11-19 MED ORDER — POLYETHYLENE GLYCOL 3350 17 G PO PACK: 17.0000 g | PACK | Freq: Two times a day (BID) | ORAL | 0 refills | Status: DC

## 2019-11-19 NOTE — Plan of Care (Signed)
Patient discharged, all care plans met/dc'd. Deanna Artis RN

## 2019-11-19 NOTE — TOC Transition Note (Signed)
Transition of Care [facility]) - CM/SW Discharge Note   Patient Details  Name: Gregory Norton MRN: [id number] Date of Birth: 05-05-1950  Transition of [facility]) CM/SW Contact:  Lennart Pall, LCSW Phone Number: 11/19/2019, 10:51 AM   Clinical Narrative:    Met briefly with pt and confirming plan for HEP and already has DME.  No TOC needs.   Final next level of care: Home/Self Care Barriers to Discharge: No Barriers Identified   Patient Goals and CMS Choice Patient states their goals for this hospitalization and ongoing recovery are:: return home today      Discharge Placement                       Discharge Plan and Services                DME Arranged: N/A DME Agency: NA       HH Arranged: NA HH Agency: NA        Social Determinants of Health (SDOH) Interventions     Readmission Risk Interventions No flowsheet data found.

## 2019-11-19 NOTE — Plan of Care (Signed)
Plan of care reviewed and discussed with the patient. 

## 2019-11-19 NOTE — Plan of Care (Signed)
  Problem: Activity: Goal: Ability to avoid complications of mobility impairment will improve Outcome: Progressing   Problem: Activity: Goal: Ability to tolerate increased activity will improve Outcome: Progressing   Problem: Pain Management: Goal: Pain level will decrease with appropriate interventions Outcome: Adequate for Discharge

## 2019-11-19 NOTE — Progress Notes (Signed)
Physical Therapy Treatment Patient Details Name: Gregory Norton MRN: [id number] DOB: Sep 17, 1950 Today's Date: 11/19/2019    History of Present Illness Patient is 69 y.o. male s/p Lt THR (posterior) on 11/18/19 with PMH significant for HTN, prostate cancer, OA, GERD, osteoporosis, previous Lt THR in 2014.    PT Comments    Pt very motivated and progressing well with mobility.  Pt up to ambulate increased distance in hall, negotiated step-up, reviewed car transfers and performed HEP with written instruction provided.  Pt eager for return home.   Follow Up Recommendations  Follow surgeon's recommendation for DC plan and follow-up therapies;Outpatient PT     Equipment Recommendations  None recommended by PT    Recommendations for Other Services       Precautions / Restrictions Precautions Precautions: Fall Restrictions Weight Bearing Restrictions: Yes LLE Weight Bearing: Weight bearing as tolerated LLE Partial Weight Bearing Percentage or Pounds: 50%    Mobility  Bed Mobility Overal bed mobility: Needs Assistance Bed Mobility: Supine to Sit     Supine to sit: Supervision     General bed mobility comments: cues for sequence and use of R LE to self assist  Transfers Overall transfer level: Needs assistance Equipment used: Rolling walker (2 wheeled) Transfers: Sit to/from Stand Sit to Stand: Supervision         General transfer comment: cues for hand placement to self assist  Ambulation/Gait Ambulation/Gait assistance: Min guard;Supervision Gait Distance (Feet): 140 Feet Assistive device: Rolling walker (2 wheeled) Gait Pattern/deviations: Step-to pattern;Step-through pattern;Decreased step length - right;Decreased step length - left;Shuffle;Trunk flexed Gait velocity: decr   General Gait Details: cues for safe step pattern and proximity to RW, pt preferring step through pattern. cues to maintain PWB status of 50% or less on Lt LE. no overt LOB  noted.   Stairs Stairs: Yes Stairs assistance: Min guard Stair Management: No rails;Step to pattern;Backwards;With walker Number of Stairs: 2 General stair comments: single step twice - cues for sequence and foot/RW placement   Wheelchair Mobility    Modified Rankin (Stroke Patients Only)       Balance Overall balance assessment: Needs assistance;Mild deficits observed, not formally tested Sitting-balance support: Feet supported Sitting balance-Leahy Scale: Good     Standing balance support: During functional activity;Bilateral upper extremity supported Standing balance-Leahy Scale: Fair                              Cognition Arousal/Alertness: Awake/alert Behavior During Therapy: WFL for tasks assessed/performed Overall Cognitive Status: Within Functional Limits for tasks assessed                                        Exercises Total Joint Exercises Ankle Circles/Pumps: AROM;Both;20 reps;Seated Quad Sets: AROM;Left;10 reps;Seated Heel Slides: AROM;20 reps;Supine;Left Hip ABduction/ADduction: AAROM;AROM;Left;15 reps;Supine Long Arc Quad: AROM;Left;15 reps;Seated    General Comments        Pertinent Vitals/Pain Pain Assessment: 0-10 Pain Score: 1  Pain Location: Lt hip Pain Descriptors / Indicators: Discomfort Pain Intervention(s): Limited activity within patient's tolerance;Monitored during session    Home Living                      Prior Function            PT Goals (current goals can now be found in the care plan  section) Acute Rehab PT Goals Patient Stated Goal: get back to water aerobics and golf PT Goal Formulation: With patient Time For Goal Achievement: 11/25/19 Potential to Achieve Goals: Good Progress towards PT goals: Progressing toward goals    Frequency    7X/week      PT Plan Current plan remains appropriate    Co-evaluation              AM-PAC PT "6 Clicks" Mobility   Outcome  Measure  Help needed turning from your back to your side while in a flat bed without using bedrails?: A Little Help needed moving from lying on your back to sitting on the side of a flat bed without using bedrails?: A Little Help needed moving to and from a bed to a chair (including a wheelchair)?: A Little Help needed standing up from a chair using your arms (e.g., wheelchair or bedside chair)?: A Little Help needed to walk in hospital room?: A Little Help needed climbing 3-5 steps with a railing? : A Little 6 Click Score: 18    End of Session Equipment Utilized During Treatment: Gait belt Activity Tolerance: Patient tolerated treatment well Patient left: in chair;with call bell/phone within reach;with chair alarm set Nurse Communication: Mobility status PT Visit Diagnosis: Difficulty in walking, not elsewhere classified (R26.2);Muscle weakness (generalized) (M62.81)     Time: 1438-8875 PT Time Calculation (min) (ACUTE ONLY): 43 min  Charges:  $Gait Training: 8-22 mins $Therapeutic Exercise: 8-22 mins $Therapeutic Activity: 8-22 mins                     Debe Coder PT Acute Rehabilitation Services Pager [phone] Office [phone]    [facility] 11/19/2019, 9:38 AM

## 2019-11-19 NOTE — Progress Notes (Signed)
   Subjective: 1 Day Post-Op Procedure(s) (LRB): POSTERIOR TOTAL HIP REVISION (Left) Patient reports that he is having no pain. Patient seen in rounds for Dr. Alvan Dame. Patient is well, and has had no acute complaints or problems. No acute events overnight. Ambulated 90 feet with PT yesterday. Denies CP, SHOB, N/V.  We will continue therapy today.   Objective: Vital signs in last 24 hours: Temp:  [97.5 F (36.4 C)-98.3 F (36.8 C)] 97.7 F (36.5 C) (11/05 0510) Pulse Rate:  [56-88] 77 (11/05 0510) Resp:  [11-18] 18 (11/05 0510) BP: (133-167)/(81-100) 144/83 (11/05 0510) SpO2:  [93 %-100 %] 98 % (11/05 0510) Weight:  [134 kg] 134 kg (11/04 0936)  Intake/Output from previous day:  Intake/Output Summary (Last 24 hours) at 11/19/2019 0826 Last data filed at 11/19/2019 0600 Gross per 24 hour  Intake 3396.66 ml  Output 4000 ml  Net -603.34 ml     Intake/Output this shift: No intake/output data recorded.  Labs: Recent Labs    11/19/19 0318  HGB 14.5   Recent Labs    11/19/19 0318  WBC 15.4*  RBC 4.64  HCT 42.5  PLT 240   Recent Labs    11/19/19 0318  NA 137  K 3.6  CL 101  CO2 25  BUN 15  CREATININE 0.90  GLUCOSE 166*  CALCIUM 8.8*   No results for input(s): LABPT, INR in the last 72 hours.  Exam: General - Patient is Alert and Oriented Extremity - Neurologically intact Sensation intact distally Intact pulses distally Dorsiflexion/Plantar flexion intact Dressing - dressing C/D/I Motor Function - intact, moving foot and toes well on exam.   Past Medical History:  Diagnosis Date  . Arthritis   . Cancer [facility]) 2014   prostate cancer  . Complication of anesthesia    low O2 sats, could not wake up after hip surgery  . GERD (gastroesophageal reflux disease)   . Hypercholesteremia   . Hypertension   . Osteoporosis   . Sleep apnea    no CPAP    Assessment/Plan: 1 Day Post-Op Procedure(s) (LRB): POSTERIOR TOTAL HIP REVISION (Left) Active Problems:    S/P left TH revision  Estimated body mass index is 38.98 kg/m as calculated from the following:   Height as of this encounter: 6\' 1"  (1.854 m).   Weight as of this encounter: 134 kg. Advance diet Up with therapy D/C IV fluids  DVT Prophylaxis - Aspirin PWB 50% LLE Hip precautions discussed with patient  Plan is to go Home after hospital stay. Plan for discharge today following 1-2 sessions of therapy as long as he continues to meet his goals. Follow up in the office in 2 weeks.   Griffith Citron, PA-C Orthopedic Surgery [phone] 11/19/2019, 8:26 AM

## 2019-11-19 NOTE — Op Note (Signed)
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  FINDINGS:  Please see dictated operative note for explanation of diagnosis, as well as for procedure itself.  PROCEDURE:  Left hip revision, acetabular component, a size 52 x 36+4 10-degree face changing liner cemented into a previously placed acetabular shell.  SURGEON:  Paralee Cancel, MD  ASSISTANT:  Danae Orleans PA-C.  Note that Gregory Norton was present for the entirety of the case from preoperative positioning, perioperative management of the operative extremity, general facilitation of the case and primary wound closure.  ANESTHESIA:  Spinal.  ESTIMATED BLOOD LOSS:  300 mL.  DRAINS:  None.  INDICATIONS  FOR PROCEDURE:  The patient is a very pleasant 69 year old male with a history of failed left hip surgery that required revision.  He underwent an initial revision left hip surgery in around 2014.  He then noted some sense of instability,  requiring revision surgery in 2015.  These years may be off by 1.  At that time, we revised his hip to a size 36+15.5 metal ball.  He had been doing very well until he recently presented to our office about a month ago with a sense of instability to the  left hip.  Radiographic workup at that time revealed that the cement that was holding his liner in place had fractured his acetabular component was loose.  This indication was reviewed with him.  We discussed surgical intervention.  The indications and  rationale for cementing the liner into shell were well documented in the past based on the orientation of his previously placed shell and the  remaining bone stock that was evident.  Given the previous discussions and as well as he had done until  recently,  he would like to proceed with revision surgery with same and similar technique.  Risks of failure, risks of infection, DVT, dislocation were reviewed.  Consent was obtained for benefit of pain relief.  DESCRIPTION OF PROCEDURE:  The patient was brought to the operative theater.  Once adequate anesthesia, preoperative antibiotics, Ancef administered, he was positioned into the right lateral decubitus position with left hip up.  The left lower extremity  was prepped and draped in sterile fashion.  Timeout was performed, identifying the patient, the planned procedure and extremity.  A portion of his old incision was used for posterior approach to the hip.  The gluteal fascia and his iliotibial band were  exposed.  I then incised these for a posterior approach to the hip.  The posterior aspect of the hip was dissected out.  We encountered clear synovial fluid, no evidence for concerns for infection.  Once the hip was exposed, we did identify fracture  cement and a loose acetabular liner, which was removed.  I was unable to remove his 15.5 mm ball from the trunnion.  I then through the assistance of Gregory Norton was able to elevate this out of the way to perform further debridement of the cement from  his acetabulum.  Once I had the vast majority of it debrided, I felt that some remaining would help with the cement interdigitation.  We opened up a new liner, a 36+4 10-degree face changing liner, size 52.  We  then mixed a batch of cement.  I then used  a bur and burred a crosshatch pattern into the backside of the liner.  We then mixed the cement and then held the component within the acetabular component until the cement had cured.  I felt that I was able to improve the angle of his acetabular  component at this point.  His acetabular shell had very little, if any, anteversion and it had a  significant amount of abduction.  I thus tried my best to maintain acetabular liner orientation with forward flexion of 20 to 30 degrees and abduction of 40  degrees.  Once the cement had fully cured, we reduced the head into the liner.  It was noted that the reduction was not very challenging and for that reason, I want him to be careful with his hip precautions as we allow his muscles to heal.  The wound  was irrigated with normal saline solution.  There was no posterior capsule to reapproximate.  We thus reapproximated the iliotibial band and gluteal fascia using a combination of #1 Vicryl and Stratafix sutures.  The remainder of the wound was closed in  layers using 2-0 Vicryl and a running Monocryl stitch.  He was then taken to the recovery room in stable condition, tolerating the procedure well.  His wound was dressed sterilely with surgical glue and Aquacel dressing.  Postoperatively, he will be  weightbearing as tolerated.  Posterior hip precautions were reviewed with therapy prior to discharge.  VN/NUANCE  D:11/19/2019 T:11/19/2019 JOB:[id number]

## 2019-11-19 NOTE — Brief Op Note (Addendum)
11/18/2019  After 2pm  PATIENT:  Gregory Norton  69 y.o. 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   Delay start of Pharmacological VTE agent (>24hrs) due to surgical blood loss or risk of bleeding: no

## 2019-11-28 NOTE — Discharge Summary (Signed)
Physician Discharge Summary   Patient ID: Gregory Norton MRN: [id number] DOB/AGE: [birth year] 69 y.o.  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  Admission Diagnoses:  Past Medical History:  Diagnosis Date  . Arthritis   . Cancer [facility]) 2014   prostate cancer  . Complication of anesthesia    low O2 sats, could not wake up after hip surgery  . GERD (gastroesophageal reflux disease)   . Hypercholesteremia   . Hypertension   . Osteoporosis   . Sleep apnea    no CPAP   Discharge Diagnoses:   Active Problems:   S/P left TH revision  Estimated body mass index is 38.98 kg/m as calculated from the following:   Height as of this encounter: 6\' 1"  (1.854 m).   Weight as of this encounter: 134 kg.  Procedure:  Procedure(s) (LRB): POSTERIOR TOTAL HIP REVISION (Left)   Consults: None  HPI: The patient is a very pleasant 69 year old male with a history of failed left hip surgery that required revision.  He underwent an initial revision left hip surgery in around 2014.  He then noted some sense of instability,  requiring revision surgery in 2015.  These years may be off by 1.  At that time, we revised his hip to a size 36+15.5 metal ball.  He had been doing very well until he recently presented to our office about a month ago with a sense of instability to the  left hip.  Radiographic workup at that time revealed that the cement that was holding his liner in place had fractured his acetabular component was loose.  This indication was reviewed with him.  We discussed surgical intervention.  The indications and  rationale for cementing the liner into shell were well documented in the past based on the orientation of his previously placed shell and the remaining bone stock that was evident.  Given the previous discussions and as well as he had done until  recently,  he would like to proceed with  revision surgery with same and similar technique.  Risks of failure, risks of infection, DVT, dislocation were reviewed.  Consent was obtained for benefit of pain relief.  Laboratory Data: Admission on 11/18/2019, Discharged on 11/19/2019  Component Date Value Ref Range Status  . WBC 11/19/2019 15.4* 4.0 - 10.5 K/uL Final  . RBC 11/19/2019 4.64  4.22 - 5.81 MIL/uL Final  . Hemoglobin 11/19/2019 14.5  13.0 - 17.0 g/dL Final  . HCT 11/19/2019 42.5  39 - 52 % Final  . MCV 11/19/2019 91.6  80.0 - 100.0 fL Final  . MCH 11/19/2019 31.3  26.0 - 34.0 pg Final  . MCHC 11/19/2019 34.1  30.0 - 36.0 g/dL Final  . RDW 11/19/2019 12.5  11.5 - 15.5 % Final  . Platelets 11/19/2019 240  150 - 400 K/uL Final  . nRBC 11/19/2019 0.0  0.0 - 0.2 % Final   Performed at [facility], [place] [address], [place], [place] [address]  . Sodium 11/19/2019 137  135 - 145 mmol/L Final  . Potassium 11/19/2019 3.6  3.5 - 5.1 mmol/L Final  . Chloride 11/19/2019 101  98 - 111 mmol/L Final  . CO2 11/19/2019 25  22 - 32 mmol/L Final  . Glucose, Bld 11/19/2019 166* 70 - 99 mg/dL Final   Glucose reference range applies only to samples taken after fasting for at least 8 hours.  Marland Kitchen  BUN 11/19/2019 15  8 - 23 mg/dL Final  . Creatinine, Ser 11/19/2019 0.90  0.61 - 1.24 mg/dL Final  . Calcium 11/19/2019 8.8* 8.9 - 10.3 mg/dL Final  . GFR, Estimated 11/19/2019 >60  >60 mL/min Final   Comment: (NOTE) Calculated using the CKD-EPI Creatinine Equation (2021)   . Anion gap 11/19/2019 11  5 - 15 Final   Performed at [facility], [place] [address] [place], [place] [address]  Hospital Outpatient Visit on 11/15/2019  Component Date Value Ref Range Status  . SARS Coronavirus 2 11/15/2019 NEGATIVE  NEGATIVE Final   Comment: (NOTE) SARS-CoV-2 target nucleic acids are NOT DETECTED.  The SARS-CoV-2 RNA is generally detectable in upper and lower respiratory specimens during the acute phase of infection.  Negative results do not preclude SARS-CoV-2 infection, do not rule out co-infections with other pathogens, and should not be used as the sole basis for treatment or other patient management decisions. Negative results must be combined with clinical observations, patient history, and epidemiological information. The expected result is Negative.  Fact Sheet for Patients: SugarRoll.be  Fact Sheet for Healthcare Providers: https://www.woods-mathews.com/  This test is not yet approved or cleared by the [place] FDA and  has been authorized for detection and/or diagnosis of SARS-CoV-2 by FDA under an Emergency Use Authorization (EUA). This EUA will remain  in effect (meaning this test can be used) for the duration of the COVID-19 declaration under Se                          ction 564(b)(1) of the Act, 21 U.S.C. section 360bbb-3(b)(1), unless the authorization is terminated or revoked sooner.  Performed at [facility], [place] [address], [place], [place] [address]   Hospital Outpatient Visit on 11/10/2019  Component Date Value Ref Range Status  . ABO/RH(D) 11/10/2019 A POS   Final  . Antibody Screen 11/10/2019 NEG   Final  . Sample Expiration 11/10/2019 11/21/2019,2359   Final  . Extend sample reason 11/10/2019    Final                   Value:NO TRANSFUSIONS OR PREGNANCY IN THE PAST 3 MONTHS Performed at [facility], [place] [address], [place], [place] [address]   . MRSA, PCR 11/10/2019 NEGATIVE  NEGATIVE Final  . Staphylococcus aureus 11/10/2019 NEGATIVE  NEGATIVE Final   Comment: (NOTE) The Xpert SA Assay (FDA approved for NASAL specimens in patients 10 years of age and older), is one component of a comprehensive surveillance program. It is not intended to diagnose infection nor to guide or monitor treatment. Performed at [facility], [place] [address], [place], [place] [address]       X-Rays:DG Pelvis Portable  Result Date: 11/18/2019 CLINICAL DATA:  Status post posterior total hip revision. Imaging in recovery. EXAM: PORTABLE PELVIS 1-2 VIEWS COMPARISON:  Pelvic radiograph 01/01/2014 FINDINGS: Left hip arthroplasty is similar in appearance to 2015 exam. Femoral head is seated in the acetabular cup. Cerclage wires are visualized. Distal most aspect of the femoral stem is not entirely included in the field of view. No evidence of acute fracture. IMPRESSION: Left hip arthroplasty.  No periprosthetic or acute fracture. Electronically Signed   By: Keith Rake M.D.   On: 11/18/2019 15:41    EKG:No orders found for this or any previous visit.   Hospital Course: JUDDSON COBERN is a 69 y.o. who was admitted  to [facility]. They were brought to the operating room on 11/18/2019 and underwent Procedure(s): POSTERIOR TOTAL HIP REVISION.  Patient tolerated the procedure well and was later transferred to the recovery room and then to the orthopaedic floor for postoperative care. They were given PO and IV analgesics for pain control following their surgery. They were given 24 hours of postoperative antibiotics of  Anti-infectives (From admission, onward)   Start     Dose/Rate Route Frequency Ordered Stop   11/18/19 1830  ceFAZolin (ANCEF) IVPB 2g/100 mL premix        2 g 200 mL/hr over 30 Minutes Intravenous Every 6 hours 11/18/19 1607 11/19/19 0103   11/18/19 0600  ceFAZolin (ANCEF) 3 g in dextrose 5 % 50 mL IVPB        3 g 100 mL/hr over 30 Minutes Intravenous On call to O.R. 11/17/19 2992 11/18/19 1246     and started on DVT prophylaxis in the form of Aspirin.   PT and OT were ordered for total joint protocol. Discharge planning consulted to help with postop disposition and equipment needs.  Patient had a good night on the evening of surgery. They started to get up OOB with therapy on POD #0. Pt was seen during rounds and was ready to go home pending progress with therapy.  He worked with therapy on POD #1 and was meeting his goals. Pt was discharged to home later that day in stable condition.  Diet: Regular diet Activity: PWB Follow-up: in 2 weeks Disposition: Home Discharged Condition: good   Discharge Instructions    Call MD / Call 911   Complete by: As directed    If you experience chest pain or shortness of breath, CALL 911 and be transported to the hospital emergency room.  If you develope a fever above 101 F, pus (white drainage) or increased drainage or redness at the wound, or calf pain, call your surgeon's office.   Change dressing   Complete by: As directed    Maintain surgical dressing until follow up in the clinic. If the edges start to pull up, may reinforce with tape. If the dressing is no longer working, may remove and cover with gauze and tape, but must keep the area dry and clean.  Call with any questions or concerns.   Constipation Prevention   Complete by: As directed    Drink plenty of fluids.  Prune juice may be helpful.  You may use a stool softener, such as Colace (over the counter) 100 mg twice a day.  Use MiraLax (over the counter) for constipation as needed.   Diet - low sodium heart healthy   Complete by: As directed    Discharge instructions   Complete by: As directed    Maintain surgical dressing until follow up in the clinic. If the edges start to pull up, may reinforce with tape. If the dressing is no longer working, may remove and cover with gauze and tape, but must keep the area dry and clean.  Follow up in 2 weeks at [facility]. Call with any questions or concerns.   Follow the hip precautions as taught in Physical Therapy   Complete by: As directed    Increase activity slowly as tolerated   Complete by: As directed    Weight bearing as tolerated with assist device (walker, cane, etc) as directed, use it as long as suggested by your surgeon or therapist, typically at least 4-6 weeks.   Partial weight bearing  Complete  by: As directed    % Body Weight: 50   Laterality: left   Extremity: Lower   TED hose   Complete by: As directed    Use stockings (TED hose) for 2 weeks on both leg(s).  You may remove them at night for sleeping.     Allergies as of 11/19/2019   No Known Allergies     Medication List    TAKE these medications   amLODipine 5 MG tablet Commonly known as: NORVASC Take 5 mg by mouth daily.   ascorbic acid 500 MG tablet Commonly known as: VITAMIN C Take 500 mg by mouth daily.   aspirin 81 MG chewable tablet Commonly known as: Aspirin Childrens Chew 1 tablet (81 mg total) by mouth 2 (two) times daily. Take for 4 weeks, then resume regular dose.   docusate sodium 100 MG capsule Commonly known as: Colace Take 1 capsule (100 mg total) by mouth 2 (two) times daily.   ferrous sulfate 325 (65 FE) MG tablet Commonly known as: FerrouSul Take 1 tablet (325 mg total) by mouth 3 (three) times daily with meals for 14 days.   HYDROcodone-acetaminophen 5-325 MG tablet Commonly known as: NORCO/VICODIN Take 1-2 tablets by mouth every 6 (six) hours as needed for severe pain.   methocarbamol 500 MG tablet Commonly known as: Robaxin Take 1 tablet (500 mg total) by mouth every 6 (six) hours as needed for muscle spasms.   multivitamin with minerals Tabs tablet Take 1 tablet by mouth every evening.   pantoprazole 40 MG tablet Commonly known as: PROTONIX Take 40 mg by mouth every morning.   polyethylene glycol 17 g packet Commonly known as: MIRALAX / GLYCOLAX Take 17 g by mouth 2 (two) times daily.   simvastatin 10 MG tablet Commonly known as: ZOCOR Take 10 mg by mouth every evening.            Discharge Care Instructions  (From admission, onward)         Start     Ordered   11/18/19 0000  Change dressing       Comments: Maintain surgical dressing until follow up in the clinic. If the edges start to pull up, may reinforce with tape. If the dressing is no longer working, may  remove and cover with gauze and tape, but must keep the area dry and clean.  Call with any questions or concerns.   11/18/19 2126   11/18/19 0000  Partial weight bearing       Question Answer Comment  % Body Weight 50   Laterality left   Extremity Lower      11/18/19 2126          Follow-up Information    Paralee Cancel, MD. Schedule an appointment as soon as possible for a visit in 2 weeks.   Specialty: Orthopedic Surgery Contact information: [address] [place] [address] [phone]               Signed: Griffith Citron, PA-C Orthopedic Surgery 11/28/2019, 5:00 PM

## 2020-08-07 NOTE — Patient Instructions (Addendum)
DUE TO COVID-19 ONLY ONE VISITOR IS ALLOWED TO COME WITH YOU AND STAY IN THE WAITING ROOM ONLY DURING PRE OP AND PROCEDURE.   **NO VISITORS ARE ALLOWED IN THE SHORT STAY AREA OR RECOVERY ROOM!!**         Your procedure is scheduled on:  Thursday, 08-17-20   Report to [facility] Main  Entrance   Report to Short Stay at 5:15 AM   [facility])    Call this number if you have problems the morning of surgery [phone]   Do not eat food :After Midnight.   May have liquids until 4:30 AM day of surgery  CLEAR LIQUID DIET  Foods Allowed                                                                     Foods Excluded  Water, Black Coffee and tea, regular and decaf              liquids that you cannot  Plain Jell-O in any flavor  (No red)                                    see through such as: Fruit ices (not with fruit pulp)                                      milk, soups, orange juice              Iced Popsicles (No red)                                      All solid food                                   Apple juices Sports drinks like Gatorade (No red) Lightly seasoned clear broth or consume(fat free) Sugar, honey syrup     Complete one Ensure drink the morning of surgery at 4:30 AM the day of surgery.        The day of surgery:  Drink ONE (1) Pre-Surgery Clear Ensure the morning of surgery. Drink in one sitting. Do not sip.  This drink was given to you during your hospital  pre-op appointment visit. Nothing else to drink after completing the Pre-Surgery Clear Ensure           If you have questions, please contact your surgeon's office.     Oral Hygiene is also important to reduce your risk of infection.                                    Remember - BRUSH YOUR TEETH THE MORNING OF SURGERY WITH YOUR REGULAR TOOTHPASTE   Do NOT smoke after Midnight   Take these medicines the morning of surgery with A SIP OF WATER:  Pantoprazole, Hydrocodone if needed  You may not have any metal on your body including jewelry, and body piercing             Do not wear  lotions, powders, cologne, or deodorant               Men may shave face and neck.   Do not bring valuables to the hospital. [place].   Contacts, dentures or bridgework may not be worn into surgery.    Patients discharged the day of surgery will not be allowed to drive home.  Special Instructions: Bring a copy of your healthcare power of attorney and living will documents         the day of surgery if you haven't scanned them in before.  Please read over the following fact sheets you were given: IF YOU HAVE QUESTIONS ABOUT YOUR PRE OP INSTRUCTIONS PLEASE CALL [place]- Preparing for Total Shoulder Arthroplasty    Before surgery, you can play an important role. Because skin is not sterile, your skin needs to be as free of germs as possible. You can reduce the number of germs on your skin by using the following products. Benzoyl Peroxide Gel Reduces the number of germs present on the skin Applied twice a day to shoulder area starting two days before surgery    ==================================================================  Please follow these instructions carefully:  BENZOYL PEROXIDE 5% GEL  Please do not use if you have an allergy to benzoyl peroxide.   If your skin becomes reddened/irritated stop using the benzoyl peroxide.  Starting two days before surgery, apply as follows: Tuesday and Wednesday Apply benzoyl peroxide in the morning and at night. Apply after taking a shower. If you are not taking a shower clean entire shoulder front, back, and side along with the armpit with a clean wet washcloth.  Place a quarter-sized dollop on your shoulder and rub in thoroughly, making sure to cover the front, back, and side of your shoulder, along with the armpit.   2 days before ____ AM   ____ PM               1 day before ____ AM   ____ PM                         Do this twice a day for two days.  (Last application is the night before surgery, AFTER using the CHG soap as described below).  Do NOT apply benzoyl peroxide gel on the day of surgery.     [place] - Preparing for Surgery Before surgery, you can play an important role.  Because skin is not sterile, your skin needs to be as free of germs as possible.  You can reduce the number of germs on your skin by washing with CHG (chlorahexidine gluconate) soap before surgery.  CHG is an antiseptic cleaner which kills germs and bonds with the skin to continue killing germs even after washing. Please DO NOT use if you have an allergy to CHG or antibacterial soaps.  If your skin becomes reddened/irritated stop using the CHG and inform your nurse when you arrive at Short Stay. Do not shave (including legs and underarms) for at least 48 hours prior to the first CHG shower.  You may shave your face/neck.  Please follow these instructions carefully:  1.  Shower with CHG Soap the night before surgery and the  morning  of surgery.  2.  If you choose to wash your hair, wash your hair first as usual with your normal  shampoo.  3.  After you shampoo, rinse your hair and body thoroughly to remove the shampoo.                             4.  Use CHG as you would any other liquid soap.  You can apply chg directly to the skin and wash.  Gently with a scrungie or clean washcloth.  5.  Apply the CHG Soap to your body ONLY FROM THE NECK DOWN.   Do   not use on face/ open                           Wound or open sores. Avoid contact with eyes, ears mouth and   genitals (private parts).                       Wash face,  Genitals (private parts) with your normal soap.             6.  Wash thoroughly, paying special attention to the area where your    surgery  will be performed.  7.  Thoroughly rinse your body with warm water from the neck down.  8.  DO NOT  shower/wash with your normal soap after using and rinsing off the CHG Soap.                9.  Pat yourself dry with a clean towel.            10.  Wear clean pajamas.            11.  Place clean sheets on your bed the night of your first shower and do not  sleep with pets. Day of Surgery : Do not apply any lotions/deodorants the morning of surgery.  Please wear clean clothes to the hospital/surgery center.  FAILURE TO FOLLOW THESE INSTRUCTIONS MAY RESULT IN THE CANCELLATION OF YOUR SURGERY  PATIENT SIGNATURE_________________________________  NURSE SIGNATURE__________________________________  ________________________________________________________________________   Adam Phenix  An incentive spirometer is a tool that can help keep your lungs clear and active. This tool measures how well you are filling your lungs with each breath. Taking long deep breaths may help reverse or decrease the chance of developing breathing (pulmonary) problems (especially infection) following: A long period of time when you are unable to move or be active. BEFORE THE PROCEDURE  If the spirometer includes an indicator to show your best effort, your nurse or respiratory therapist will set it to a desired goal. If possible, sit up straight or lean slightly forward. Try not to slouch. Hold the incentive spirometer in an upright position. INSTRUCTIONS FOR USE  Sit on the edge of your bed if possible, or sit up as far as you can in bed or on a chair. Hold the incentive spirometer in an upright position. Breathe out normally. Place the mouthpiece in your mouth and seal your lips tightly around it. Breathe in slowly and as deeply as possible, raising the piston or the ball toward the top of the column. Hold your breath for 3-5 seconds or for as long as possible. Allow the piston or ball to fall to the bottom of the column. Remove the mouthpiece from your mouth and breathe out normally. Rest for a  few  seconds and repeat Steps 1 through 7 at least 10 times every 1-2 hours when you are awake. Take your time and take a few normal breaths between deep breaths. The spirometer may include an indicator to show your best effort. Use the indicator as a goal to work toward during each repetition. After each set of 10 deep breaths, practice coughing to be sure your lungs are clear. If you have an incision (the cut made at the time of surgery), support your incision when coughing by placing a pillow or rolled up towels firmly against it. Once you are able to get out of bed, walk around indoors and cough well. You may stop using the incentive spirometer when instructed by your caregiver.  RISKS AND COMPLICATIONS Take your time so you do not get dizzy or light-headed. If you are in pain, you may need to take or ask for pain medication before doing incentive spirometry. It is harder to take a deep breath if you are having pain. AFTER USE Rest and breathe slowly and easily. It can be helpful to keep track of a log of your progress. Your caregiver can provide you with a simple table to help with this. If you are using the spirometer at home, follow these instructions: [place] IF:  You are having difficultly using the spirometer. You have trouble using the spirometer as often as instructed. Your pain medication is not giving enough relief while using the spirometer. You develop fever of 100.5 F (38.1 C) or higher. SEEK IMMEDIATE MEDICAL CARE IF:  You cough up bloody sputum that had not been present before. You develop fever of 102 F (38.9 C) or greater. You develop worsening pain at or near the incision site. MAKE SURE YOU:  Understand these instructions. Will watch your condition. Will get help right away if you are not doing well or get worse. Document Released: 05/13/2006 Document Revised: 03/25/2011 Document Reviewed: 07/14/2006 [facility] Patient Information 2014 [place],  [place].   ________________________________________________________________________

## 2020-08-08 ENCOUNTER — Encounter (HOSPITAL_COMMUNITY): Payer: Self-pay

## 2020-08-08 ENCOUNTER — Other Ambulatory Visit: Payer: Self-pay

## 2020-08-08 ENCOUNTER — Encounter (HOSPITAL_COMMUNITY)
Admission: RE | Admit: 2020-08-08 | Discharge: 2020-08-08 | Disposition: A | Discharge status: Home / Self Care | Payer: Medicare Other | Source: Ambulatory Visit | Attending: Orthopedic Surgery | Admitting: Orthopedic Surgery

## 2020-08-08 DIAGNOSIS — Z01812 Encounter for preprocedural laboratory examination: Secondary | ICD-10-CM | POA: Diagnosis not present

## 2020-08-08 HISTORY — DX: Prediabetes: R73.03

## 2020-08-08 LAB — SURGICAL PCR SCREEN
MRSA, PCR: NEGATIVE
Staphylococcus aureus: NEGATIVE

## 2020-08-08 LAB — CBC
HCT: 46 % (ref 39.0–52.0)
Hemoglobin: 15.5 g/dL (ref 13.0–17.0)
MCH: 30.7 pg (ref 26.0–34.0)
MCHC: 33.7 g/dL (ref 30.0–36.0)
MCV: 91.1 fL (ref 80.0–100.0)
Platelets: 295 10*3/uL (ref 150–400)
RBC: 5.05 MIL/uL (ref 4.22–5.81)
RDW: 13.7 % (ref 11.5–15.5)
WBC: 10 10*3/uL (ref 4.0–10.5)
nRBC: 0 % (ref 0.0–0.2)

## 2020-08-08 LAB — BASIC METABOLIC PANEL
Anion gap: 6 (ref 5–15)
BUN: 19 mg/dL (ref 8–23)
CO2: 28 mmol/L (ref 22–32)
Calcium: 9.4 mg/dL (ref 8.9–10.3)
Chloride: 103 mmol/L (ref 98–111)
Creatinine, Ser: 0.97 mg/dL (ref 0.61–1.24)
GFR, Estimated: >60 mL/min (ref >60)
Glucose, Bld: 101 mg/dL — ABNORMAL HIGH (ref 70–99)
Potassium: 3.7 mmol/L (ref 3.5–5.1)
Sodium: 137 mmol/L (ref 135–145)

## 2020-08-08 NOTE — Progress Notes (Addendum)
COVID Vaccine Completed: Yes Date COVID Vaccine completed: x2 Has received booster:x2 COVID vaccine manufacturer: Pfizer     Date of COVID positive in last 90 days: No  PCP - Rafaela Arville Care, MD Cardiologist - N/A  Chest x-ray - N/A EKG - fax sent requesting from doctor's office Stress Test - N/A ECHO - N/A Cardiac Cath - N/A Pacemaker/ICD device last checked:N/A  Sleep Study - Yes CPAP - No  Fasting Blood Sugar - N/A Checks Blood Sugar __N/A___ times a day  Blood Thinner Instructions:  N/A Aspirin Instructions: N/A Last Dose: N/A  Activity level:  Able to exercise without symptoms     Anesthesia review: N/A  Patient denies shortness of breath, fever, cough and chest pain at PAT appointment   Patient verbalized understanding of instructions that were given to them at the PAT appointment. Patient was also instructed that they will need to review over the PAT instructions again at home before surgery.

## 2020-08-09 LAB — HEMOGLOBIN A1C
Hgb A1c MFr Bld: 5.8 % — ABNORMAL HIGH (ref 4.8–5.6)
Mean Plasma Glucose: 120 mg/dL

## 2020-08-17 ENCOUNTER — Ambulatory Visit (HOSPITAL_COMMUNITY)
Admission: RE | Admit: 2020-08-17 | Discharge: 2020-08-17 | Disposition: A | Discharge status: Home / Self Care | Payer: Medicare Other | Attending: Orthopedic Surgery | Admitting: Orthopedic Surgery

## 2020-08-17 ENCOUNTER — Encounter (HOSPITAL_COMMUNITY): Payer: Self-pay | Admitting: Orthopedic Surgery

## 2020-08-17 ENCOUNTER — Ambulatory Visit (HOSPITAL_COMMUNITY): Payer: Medicare Other | Admitting: Anesthesiology

## 2020-08-17 ENCOUNTER — Encounter (HOSPITAL_COMMUNITY)
Admission: RE | Disposition: A | Discharge status: Home / Self Care | Payer: Self-pay | Source: Home / Self Care | Attending: Orthopedic Surgery

## 2020-08-17 DIAGNOSIS — Z87891 Personal history of nicotine dependence: Secondary | ICD-10-CM | POA: Insufficient documentation

## 2020-08-17 DIAGNOSIS — M25811 Other specified joint disorders, right shoulder: Secondary | ICD-10-CM | POA: Diagnosis not present

## 2020-08-17 DIAGNOSIS — Z8546 Personal history of malignant neoplasm of prostate: Secondary | ICD-10-CM | POA: Diagnosis not present

## 2020-08-17 DIAGNOSIS — Z96642 Presence of left artificial hip joint: Secondary | ICD-10-CM | POA: Insufficient documentation

## 2020-08-17 DIAGNOSIS — M19011 Primary osteoarthritis, right shoulder: Secondary | ICD-10-CM | POA: Insufficient documentation

## 2020-08-17 DIAGNOSIS — Z79899 Other long term (current) drug therapy: Secondary | ICD-10-CM | POA: Insufficient documentation

## 2020-08-17 HISTORY — PX: REVERSE SHOULDER ARTHROPLASTY: SHX5054

## 2020-08-17 SURGERY — ARTHROPLASTY, SHOULDER, TOTAL, REVERSE
Anesthesia: General | Site: Shoulder | Laterality: Right

## 2020-08-17 MED ORDER — NAPROXEN 500 MG PO TABS: 500.0000 mg | ORAL_TABLET | Freq: Two times a day (BID) | ORAL | 1 refills | Status: DC

## 2020-08-17 MED ORDER — SUGAMMADEX SODIUM 500 MG/5ML IV SOLN
INTRAVENOUS | Status: DC | PRN
  Administered 2020-08-17: 300 mg via INTRAVENOUS

## 2020-08-17 MED ORDER — ROCURONIUM BROMIDE 10 MG/ML (PF) SYRINGE
PREFILLED_SYRINGE | INTRAVENOUS | Status: DC | PRN
  Administered 2020-08-17: 80 mg via INTRAVENOUS
  Administered 2020-08-17: 20 mg via INTRAVENOUS

## 2020-08-17 MED ORDER — FENTANYL CITRATE (PF) 100 MCG/2ML IJ SOLN
INTRAMUSCULAR | Status: AC
  Filled 2020-08-17: qty 2

## 2020-08-17 MED ORDER — CEFAZOLIN SODIUM-DEXTROSE 1-4 GM/50ML-% IV SOLN
1.0000 g | Freq: Once | INTRAVENOUS | Status: AC
  Administered 2020-08-17: 1 g via INTRAVENOUS
  Filled 2020-08-17: qty 50

## 2020-08-17 MED ORDER — ONDANSETRON HCL 4 MG/2ML IJ SOLN
INTRAMUSCULAR | Status: DC | PRN
  Administered 2020-08-17: 4 mg via INTRAVENOUS

## 2020-08-17 MED ORDER — ACETAMINOPHEN 500 MG PO TABS
1000.0000 mg | ORAL_TABLET | Freq: Once | ORAL | Status: AC
  Administered 2020-08-17: 1000 mg via ORAL
  Filled 2020-08-17: qty 2

## 2020-08-17 MED ORDER — ORAL CARE MOUTH RINSE: 15.0000 mL | Freq: Once | OROMUCOSAL | Status: AC

## 2020-08-17 MED ORDER — OXYCODONE HCL 5 MG PO TABS
ORAL_TABLET | ORAL | Status: AC
  Filled 2020-08-17: qty 1

## 2020-08-17 MED ORDER — PROMETHAZINE HCL 25 MG/ML IJ SOLN: 6.2500 mg | INTRAMUSCULAR | Status: DC | PRN

## 2020-08-17 MED ORDER — ONDANSETRON HCL 4 MG PO TABS: 4.0000 mg | ORAL_TABLET | Freq: Three times a day (TID) | ORAL | 0 refills | Status: DC | PRN

## 2020-08-17 MED ORDER — FENTANYL CITRATE (PF) 250 MCG/5ML IJ SOLN
INTRAMUSCULAR | Status: DC | PRN
  Administered 2020-08-17 (×3): 50 ug via INTRAVENOUS

## 2020-08-17 MED ORDER — LACTATED RINGERS IV BOLUS
250.0000 mL | Freq: Once | INTRAVENOUS | Status: AC
  Administered 2020-08-17: 250 mL via INTRAVENOUS

## 2020-08-17 MED ORDER — TRANEXAMIC ACID 1000 MG/10ML IV SOLN: 1000.0000 mg | INTRAVENOUS | Status: DC

## 2020-08-17 MED ORDER — ROCURONIUM BROMIDE 10 MG/ML (PF) SYRINGE
PREFILLED_SYRINGE | INTRAVENOUS | Status: AC
  Filled 2020-08-17: qty 10

## 2020-08-17 MED ORDER — PHENYLEPHRINE HCL-NACL 20-0.9 MG/250ML-% IV SOLN
INTRAVENOUS | Status: DC | PRN
  Administered 2020-08-17: 25 ug/min via INTRAVENOUS

## 2020-08-17 MED ORDER — FENTANYL CITRATE (PF) 100 MCG/2ML IJ SOLN: 25.0000 ug | INTRAMUSCULAR | Status: DC | PRN

## 2020-08-17 MED ORDER — LACTATED RINGERS IV SOLN: INTRAVENOUS | Status: DC

## 2020-08-17 MED ORDER — VANCOMYCIN HCL 1000 MG IV SOLR
INTRAVENOUS | Status: DC | PRN
  Administered 2020-08-17: 1000 mg via TOPICAL

## 2020-08-17 MED ORDER — OXYCODONE HCL 5 MG/5ML PO SOLN: 5.0000 mg | Freq: Once | ORAL | Status: AC | PRN

## 2020-08-17 MED ORDER — DEXAMETHASONE SODIUM PHOSPHATE 10 MG/ML IJ SOLN
INTRAMUSCULAR | Status: AC
  Filled 2020-08-17: qty 1

## 2020-08-17 MED ORDER — MIDAZOLAM HCL 2 MG/2ML IJ SOLN
INTRAMUSCULAR | Status: AC
  Filled 2020-08-17: qty 2

## 2020-08-17 MED ORDER — BUPIVACAINE LIPOSOME 1.3 % IJ SUSP
INTRAMUSCULAR | Status: DC | PRN
  Administered 2020-08-17: 10 mL via PERINEURAL

## 2020-08-17 MED ORDER — 0.9 % SODIUM CHLORIDE (POUR BTL) OPTIME
TOPICAL | Status: DC | PRN
  Administered 2020-08-17: 1000 mL

## 2020-08-17 MED ORDER — LIDOCAINE 2% (20 MG/ML) 5 ML SYRINGE
INTRAMUSCULAR | Status: DC | PRN
  Administered 2020-08-17: 80 mg via INTRAVENOUS

## 2020-08-17 MED ORDER — CHLORHEXIDINE GLUCONATE 0.12 % MT SOLN
15.0000 mL | Freq: Once | OROMUCOSAL | Status: AC
  Administered 2020-08-17: 15 mL via OROMUCOSAL

## 2020-08-17 MED ORDER — CEFAZOLIN IN SODIUM CHLORIDE 3-0.9 GM/100ML-% IV SOLN
3.0000 g | INTRAVENOUS | Status: DC
  Filled 2020-08-17: qty 100

## 2020-08-17 MED ORDER — PROPOFOL 10 MG/ML IV BOLUS
INTRAVENOUS | Status: AC
  Filled 2020-08-17: qty 20

## 2020-08-17 MED ORDER — ONDANSETRON HCL 4 MG/2ML IJ SOLN
INTRAMUSCULAR | Status: AC
  Filled 2020-08-17: qty 2

## 2020-08-17 MED ORDER — CEFAZOLIN SODIUM-DEXTROSE 2-4 GM/100ML-% IV SOLN
2.0000 g | Freq: Once | INTRAVENOUS | Status: AC
  Administered 2020-08-17: 2 g via INTRAVENOUS

## 2020-08-17 MED ORDER — SUGAMMADEX SODIUM 500 MG/5ML IV SOLN
INTRAVENOUS | Status: AC
  Filled 2020-08-17: qty 5

## 2020-08-17 MED ORDER — LIDOCAINE 2% (20 MG/ML) 5 ML SYRINGE
INTRAMUSCULAR | Status: AC
  Filled 2020-08-17: qty 5

## 2020-08-17 MED ORDER — TRANEXAMIC ACID-NACL 1000-0.7 MG/100ML-% IV SOLN
1000.0000 mg | INTRAVENOUS | Status: AC
  Administered 2020-08-17: 1000 mg via INTRAVENOUS
  Filled 2020-08-17: qty 100

## 2020-08-17 MED ORDER — BUPIVACAINE HCL (PF) 0.5 % IJ SOLN
INTRAMUSCULAR | Status: DC | PRN
  Administered 2020-08-17: 15 mL via PERINEURAL

## 2020-08-17 MED ORDER — LACTATED RINGERS IV BOLUS
500.0000 mL | Freq: Once | INTRAVENOUS | Status: AC
  Administered 2020-08-17: 500 mL via INTRAVENOUS

## 2020-08-17 MED ORDER — OXYCODONE-ACETAMINOPHEN 5-325 MG PO TABS: 1.0000 | ORAL_TABLET | ORAL | 0 refills | Status: DC | PRN

## 2020-08-17 MED ORDER — MIDAZOLAM HCL 2 MG/2ML IJ SOLN
INTRAMUSCULAR | Status: DC | PRN
  Administered 2020-08-17 (×2): 1 mg via INTRAVENOUS

## 2020-08-17 MED ORDER — VANCOMYCIN HCL 1000 MG IV SOLR
INTRAVENOUS | Status: AC
  Filled 2020-08-17: qty 1000

## 2020-08-17 MED ORDER — AMISULPRIDE (ANTIEMETIC) 5 MG/2ML IV SOLN: 10.0000 mg | Freq: Once | INTRAVENOUS | Status: DC | PRN

## 2020-08-17 MED ORDER — PROPOFOL 10 MG/ML IV BOLUS
INTRAVENOUS | Status: DC | PRN
  Administered 2020-08-17: 150 mg via INTRAVENOUS

## 2020-08-17 MED ORDER — METHOCARBAMOL 500 MG PO TABS: 500.0000 mg | ORAL_TABLET | Freq: Four times a day (QID) | ORAL | 0 refills | Status: DC | PRN

## 2020-08-17 MED ORDER — PHENYLEPHRINE HCL (PRESSORS) 10 MG/ML IV SOLN
INTRAVENOUS | Status: AC
  Filled 2020-08-17: qty 2

## 2020-08-17 MED ORDER — EPHEDRINE SULFATE-NACL 50-0.9 MG/10ML-% IV SOSY
PREFILLED_SYRINGE | INTRAVENOUS | Status: DC | PRN
  Administered 2020-08-17 (×2): 5 mg via INTRAVENOUS

## 2020-08-17 MED ORDER — OXYCODONE HCL 5 MG PO TABS
5.0000 mg | ORAL_TABLET | Freq: Once | ORAL | Status: AC | PRN
  Administered 2020-08-17: 5 mg via ORAL

## 2020-08-17 MED ORDER — DEXAMETHASONE SODIUM PHOSPHATE 10 MG/ML IJ SOLN
INTRAMUSCULAR | Status: DC | PRN
  Administered 2020-08-17: 10 mg via INTRAVENOUS

## 2020-08-17 MED ORDER — STERILE WATER FOR IRRIGATION IR SOLN
Status: DC | PRN
  Administered 2020-08-17: 2000 mL

## 2020-08-17 SURGICAL SUPPLY — 71 items
ADH SKN CLS APL DERMABOND .7 (GAUZE/BANDAGES/DRESSINGS) ×1
AID PSTN UNV HD RSTRNT DISP (MISCELLANEOUS) ×1
BAG COUNTER SPONGE SURGICOUNT (BAG) IMPLANT
BAG SPEC THK2 15X12 ZIP CLS (MISCELLANEOUS) ×1
BAG SPNG CNTER NS LX DISP (BAG)
BAG ZIPLOCK 12X15 (MISCELLANEOUS) ×2 IMPLANT
BLADE SAW SGTL 83.5X18.5 (BLADE) ×2 IMPLANT
BSPLAT GLND +2X24 MDLR (Joint) ×1 IMPLANT
COOLER ICEMAN CLASSIC (MISCELLANEOUS) ×2 IMPLANT
COVER BACK TABLE 60X90IN (DRAPES) ×2 IMPLANT
COVER SURGICAL LIGHT HANDLE (MISCELLANEOUS) ×2 IMPLANT
CUP SUT UNIV REVERS 39 NEU (Shoulder) ×1 IMPLANT
DERMABOND ADVANCED (GAUZE/BANDAGES/DRESSINGS) ×1
DERMABOND ADVANCED .7 DNX12 (GAUZE/BANDAGES/DRESSINGS) ×1 IMPLANT
DRAPE INCISE IOBAN 66X45 STRL (DRAPES) IMPLANT
DRAPE ORTHO SPLIT 77X108 STRL (DRAPES) ×4
DRAPE SHEET LG 3/4 BI-LAMINATE (DRAPES) ×2 IMPLANT
DRAPE SURG 17X11 SM STRL (DRAPES) ×2 IMPLANT
DRAPE SURG ORHT 6 SPLT 77X108 (DRAPES) ×2 IMPLANT
DRAPE TOP 10253 STERILE (DRAPES) ×2 IMPLANT
DRAPE U-SHAPE 47X51 STRL (DRAPES) ×2 IMPLANT
DRESSING AQUACEL AG SP 3.5X6 (GAUZE/BANDAGES/DRESSINGS) ×1 IMPLANT
DRSG AQUACEL AG ADV 3.5X10 (GAUZE/BANDAGES/DRESSINGS) ×1 IMPLANT
DRSG AQUACEL AG SP 3.5X6 (GAUZE/BANDAGES/DRESSINGS) ×2
DURAPREP 26ML APPLICATOR (WOUND CARE) ×2 IMPLANT
ELECT BLADE TIP CTD 4 INCH (ELECTRODE) ×2 IMPLANT
ELECT REM PT RETURN 15FT ADLT (MISCELLANEOUS) ×2 IMPLANT
FACESHIELD WRAPAROUND (MASK) ×8 IMPLANT
FACESHIELD WRAPAROUND OR TEAM (MASK) ×4 IMPLANT
GLENOID UNI REV MOD 24 +2 LAT (Joint) ×1 IMPLANT
GLENOSPHERE 39+4 LAT/24 UNI RV (Joint) ×1 IMPLANT
GLOVE SRG 8 PF TXTR STRL LF DI (GLOVE) ×1 IMPLANT
GLOVE SURG ENC MOIS LTX SZ7 (GLOVE) ×2 IMPLANT
GLOVE SURG ENC MOIS LTX SZ7.5 (GLOVE) ×2 IMPLANT
GLOVE SURG UNDER POLY LF SZ7 (GLOVE) ×2 IMPLANT
GLOVE SURG UNDER POLY LF SZ8 (GLOVE) ×2
GOWN STRL REUS W/TWL LRG LVL3 (GOWN DISPOSABLE) ×4 IMPLANT
INSERT HUMERAL 39/+6 (Insert) ×1 IMPLANT
KIT BASIN OR (CUSTOM PROCEDURE TRAY) ×2 IMPLANT
KIT TURNOVER KIT A (KITS) ×2 IMPLANT
MANIFOLD NEPTUNE II (INSTRUMENTS) ×2 IMPLANT
NDL TAPERED W/ NITINOL LOOP (MISCELLANEOUS) ×1 IMPLANT
NEEDLE TAPERED W/ NITINOL LOOP (MISCELLANEOUS) ×2 IMPLANT
NS IRRIG 1000ML POUR BTL (IV SOLUTION) ×2 IMPLANT
PACK SHOULDER (CUSTOM PROCEDURE TRAY) ×2 IMPLANT
PAD ARMBOARD 7.5X6 YLW CONV (MISCELLANEOUS) ×2 IMPLANT
PAD COLD SHLDR WRAP-ON (PAD) ×2 IMPLANT
PIN NITINOL TARGETER 2.8 (PIN) IMPLANT
PIN SET MODULAR GLENOID SYSTEM (PIN) IMPLANT
RESTRAINT HEAD UNIVERSAL NS (MISCELLANEOUS) ×2 IMPLANT
SCREW CENTRAL MOD 30MM (Screw) ×1 IMPLANT
SCREW PERI LOCK 5.5X24 (Screw) ×1 IMPLANT
SCREW PERIPHERAL 5.5X20 LOCK (Screw) ×2 IMPLANT
SCREW PERIPHERAL 5.5X28 LOCK (Screw) ×1 IMPLANT
SLING ARM FOAM STRAP LRG (SOFTGOODS) ×1 IMPLANT
SLING ARM FOAM STRAP MED (SOFTGOODS) IMPLANT
SPONGE T-LAP 18X18 ~~LOC~~+RFID (SPONGE) IMPLANT
STEM HUMERAL UNI REVERS SZ9 (Stem) ×1 IMPLANT
SUCTION FRAZIER HANDLE 12FR (TUBING) ×2
SUCTION TUBE FRAZIER 12FR DISP (TUBING) ×1 IMPLANT
SUT FIBERWIRE #2 38 T-5 BLUE (SUTURE)
SUT MNCRL AB 3-0 PS2 18 (SUTURE) ×2 IMPLANT
SUT MON AB 2-0 CT1 36 (SUTURE) ×2 IMPLANT
SUT VIC AB 1 CT1 36 (SUTURE) ×2 IMPLANT
SUTURE FIBERWR #2 38 T-5 BLUE (SUTURE) IMPLANT
SUTURE TAPE 1.3 40 TPR END (SUTURE) ×2 IMPLANT
SUTURETAPE 1.3 40 TPR END (SUTURE) ×4
TOWEL OR 17X26 10 PK STRL BLUE (TOWEL DISPOSABLE) ×2 IMPLANT
TOWEL OR NON WOVEN STRL DISP B (DISPOSABLE) ×2 IMPLANT
WATER STERILE IRR 1000ML POUR (IV SOLUTION) ×4 IMPLANT
YANKAUER SUCT BULB TIP 10FT TU (MISCELLANEOUS) ×2 IMPLANT

## 2020-08-17 NOTE — Anesthesia Postprocedure Evaluation (Signed)
Anesthesia Post Note  Patient: Gregory Norton  Procedure(s) Performed: REVERSE SHOULDER ARTHROPLASTY (Right: Shoulder)     Patient location during evaluation: PACU Anesthesia Type: General and Regional Level of consciousness: awake Pain management: pain level controlled Vital Signs Assessment: post-procedure vital signs reviewed and stable Respiratory status: spontaneous breathing and respiratory function stable Cardiovascular status: stable Postop Assessment: no apparent nausea or vomiting Anesthetic complications: no   No notable events documented.  Last Vitals:  Vitals:   08/17/20 1042 08/17/20 1045  BP: (!) 146/82 134/82  Pulse: 77 80  Resp: 18   Temp: 36.4 C   SpO2: 94% 93%    Last Pain:  Vitals:   08/17/20 1045  TempSrc:   PainSc: 4                  [name]

## 2020-08-17 NOTE — Discharge Instructions (Signed)
 Kevin M. Supple, M.D., F.A.A.O.S. Orthopaedic Surgery Specializing in Arthroscopic and Reconstructive Surgery of the Shoulder [phone] [address] - [place], [place] [address] - Fax [phone]   POST-OP TOTAL SHOULDER REPLACEMENT INSTRUCTIONS  1. Follow up in the office for your first post-op appointment 10-14 days from the date of your surgery. If you do not already have a scheduled appointment, our office will contact you to schedule.  2. The bandage over your incision is waterproof. You may begin showering with this dressing on. You may leave this dressing on until first follow up appointment within 2 weeks. We prefer you leave this dressing in place until follow up however after 5-7 days if you are having itching or skin irritation and would like to remove it you may do so. Go slow and tug at the borders gently to break the bond the dressing has with the skin. At this point if there is no drainage it is okay to go without a bandage or you may cover it with a light guaze and tape. You can also expect significant bruising around your shoulder that will drift down your arm and into your chest wall. This is very normal and should resolve over several days.   3. Wear your sling/immobilizer at all times except to perform the exercises below or to occasionally let your arm dangle by your side to stretch your elbow. You also need to sleep in your sling immobilizer until instructed otherwise. It is ok to remove your sling if you are sitting in a controlled environment and allow your arm to rest in a position of comfort by your side or on your lap with pillows to give your neck and skin a break from the sling. You may remove it to allow arm to dangle by side to shower. If you are up walking around and when you go to sleep at night you need to wear it.  4. Range of motion to your elbow, wrist, and hand are encouraged 3-5 times daily. Exercise to your hand and fingers helps to reduce  swelling you may experience.   5. Prescriptions for a pain medication and a muscle relaxant are provided for you. It is recommended that if you are experiencing pain that you pain medication alone is not controlling, add the muscle relaxant along with the pain medication which can give additional pain relief. The first 1-2 days is generally the most severe of your pain and then should gradually decrease. As your pain lessens it is recommended that you decrease your use of the pain medications to an "as needed basis'" only and to always comply with the recommended dosages of the pain medications.  6. Pain medications can produce constipation along with their use. If you experience this, the use of an over the counter stool softener or laxative daily is recommended.   7. For additional questions or concerns, please do not hesitate to call the office. If after hours there is an answering service to forward your concerns to the physician on call.  8.Pain control following an exparel block  To help control your post-operative pain you received a nerve block  performed with Exparel which is a long acting anesthetic (numbing agent) which can provide pain relief and sensations of numbness (and relief of pain) in the operative shoulder and arm for up to 3 days. Sometimes it provides mixed relief, meaning you may still have numbness in certain areas of the arm but can still be able to   move  parts of that arm, hand, and fingers. We recommend that your prescribed pain medications  be used as needed. We do not feel it is necessary to "pre medicate" and "stay ahead" of pain.  Taking narcotic pain medications when you are not having any pain can lead to unnecessary and potentially dangerous side effects.    9. Use the ice machine as much as possible in the first 5-7 days from surgery, then you can wean its use to as needed. The ice typically needs to be replaced every 6 hours, instead of ice you can actually freeze  water bottles to put in the cooler and then fill water around them to avoid having to purchase ice. You can have spare water bottles freezing to allow you to rotate them once they have melted. Try to have a thin shirt or light cloth or towel under the ice wrap to protect your skin.   FOR ADDITIONAL INFO ON ICE MACHINE AND INSTRUCTIONS GO TO THE WEBSITE AT  https://www.djoglobal.com/products/donjoy/donjoy-iceman-[email or web address]  10.  We recommend that you avoid any dental work or cleaning in the first 3 months following your joint replacement. This is to help minimize the possibility of infection from the bacteria in your mouth that enters your bloodstream during dental work. We also recommend that you take an antibiotic prior to your dental work for the first year after your shoulder replacement to further help reduce that risk. Please simply contact our office for antibiotics to be sent to your pharmacy prior to dental work.  11. Dental Antibiotics:  In most cases prophylactic antibiotics for Dental procdeures after total joint surgery are not necessary.  Exceptions are as follows:  1. History of prior total joint infection  2. Severely immunocompromised (Organ Transplant, cancer chemotherapy, Rheumatoid biologic meds such as Humera)  3. Poorly controlled diabetes (A1C &gt; 8.0, blood glucose over 200)  If you have one of these conditions, contact your surgeon for an antibiotic prescription, prior to your dental procedure.   POST-OP EXERCISES  Pendulum Exercises  Perform pendulum exercises while standing and bending at the waist. Support your uninvolved arm on a table or chair and allow your operated arm to hang freely. Make sure to do these exercises passively - not using you shoulder muscles. These exercises can be performed once your nerve block effects have worn off.  Repeat 20 times. Do 3 sessions per day.     

## 2020-08-17 NOTE — H&P (Signed)
Gregory Norton    Chief Complaint: Right shoulder osteoarthritis HPI: The patient is a 70 y.o. male with chronic and progressively increasing right shoulder pain related to severe osteoarthritis.  Due to his increasing functional imitations and failure to respond to prolonged attempts at conservative management, he is brought to the operating room at this time for planned right shoulder reverse arthroplasty  Past Medical History:  Diagnosis Date   Arthritis    Cancer ([place]) 2014   prostate cancer   Complication of anesthesia    low O2 sats, could not wake up after hip surgery   GERD (gastroesophageal reflux disease)    Hypercholesteremia    Hypertension    Osteoporosis    Pre-diabetes    Sleep apnea    no CPAP    Past Surgical History:  Procedure Laterality Date   ACETABULAR REVISION Left 01/01/2014   Procedure: ACETABULAR REVISION;  Surgeon: [name], MD;  Location: [facility];  Service: Orthopedics;  Laterality: Left;   APPENDECTOMY     FOOT SURGERY Bilateral 10 years ago   6 surgeries after crushing both feet   HIP OPEN REDUCTION Left    HIP SURGERY Right at 70 years old   JOINT REPLACEMENT     PROSTATECTOMY  March 2014   SHOULDER SURGERY Right at 70 years old   TOTAL HIP REVISION Left 07/20/2012   Procedure: LEFT TOTAL HIP REVISION;  Surgeon: [name], MD;  Location: [facility];  Service: Orthopedics;  Laterality: Left;   TOTAL HIP REVISION Left 11/18/2019   Procedure: POSTERIOR TOTAL HIP REVISION;  Surgeon: Paralee Cancel, MD;  Location: [facility] OR[facility];  Service: Orthopedics;  Laterality: Left;  90 mins    History reviewed. No pertinent family history.  Social History:  reports that he quit smoking about 42 years ago. His smoking use included cigarettes. He has never used smokeless tobacco. He reports current alcohol use. He reports that he does not use drugs.   Medications Prior to Admission  Medication Sig Dispense Refill   amLODipine (NORVASC) 5 MG tablet Take 5 mg by  mouth every evening.     ascorbic acid (VITAMIN C) 500 MG tablet Take 500 mg by mouth daily.     docusate sodium (COLACE) 100 MG capsule Take 1 capsule (100 mg total) by mouth 2 (two) times daily. 28 capsule 0   HYDROcodone-acetaminophen (NORCO/VICODIN) 5-325 MG tablet Take 1-2 tablets by mouth every 6 (six) hours as needed for severe pain. 20 tablet 0   methocarbamol (ROBAXIN) 500 MG tablet Take 1 tablet (500 mg total) by mouth every 6 (six) hours as needed for muscle spasms. 40 tablet 0   Multiple Vitamin (MULTIVITAMIN WITH MINERALS) TABS Take 1 tablet by mouth every evening.      pantoprazole (PROTONIX) 40 MG tablet Take 40 mg by mouth every morning.      polyethylene glycol (MIRALAX / GLYCOLAX) 17 g packet Take 17 g by mouth 2 (two) times daily. 28 packet 0   simvastatin (ZOCOR) 10 MG tablet Take 10 mg by mouth every evening.      ferrous sulfate (FERROUSUL) 325 (65 FE) MG tablet Take 1 tablet (325 mg total) by mouth 3 (three) times daily with meals for 14 days. 42 tablet 0     Physical Exam: Right shoulder demonstrates profoundly restricted motion with severe pain as noted at recent office visits.  Otherwise neurovascular intact in the right upper extremity.  Plain radiographs confirm severe osteoarthritis with marked bony deformity,  subchondral sclerosis, and peripheral osteophyte formation.  Vitals  Temp:  [98 F (36.7 C)] 98 F (36.7 C) (08/04 0545) Pulse Rate:  [73] 73 (08/04 0545) Resp:  [14] 14 (08/04 0545) BP: (148)/(91) 148/91 (08/04 0545) SpO2:  [98 %] 98 % (08/04 0545) Weight:  [id number] kg] 127 kg (08/04 0614)  Assessment/Plan  Impression: Right shoulder osteoarthritis  Plan of Action: Procedure(s): REVERSE SHOULDER ARTHROPLASTY  Gregory Norton 08/17/2020, 6:42 AM Contact # [phone]

## 2020-08-17 NOTE — Op Note (Signed)
08/17/2020  9:27 AM  PATIENT:   Gregory Norton  70 y.o. male  PRE-OPERATIVE DIAGNOSIS:  Right shoulder osteoarthritis with associated severe rotator cuff dysfunction  POST-OPERATIVE DIAGNOSIS: Same  PROCEDURE: Right shoulder reverse arthroplasty utilizing a press-fit size 9 Arthrex stem with a neutral metaphysis, +6 polyethylene insert, 39/+4 glenosphere on a small/+2 baseplate  SURGEON:  Kerilyn Cortner, Metta Clines M.D.  ASSISTANTS: Jenetta Loges, PA-C  ANESTHESIA:   General endotracheal and interscalene block with Exparel  EBL: 150 cc  SPECIMEN: None  Drains: None   PATIENT DISPOSITION:  PACU - hemodynamically stable.    PLAN OF CARE: Discharge to home after PACU  Brief history:  Patient is a 70 year old male with a remote history of recurrent right shoulder instability status post a previous open anterior reconstruction performed many years ago, now with chronic and progressively increasing right shoulder pain related to severe post instability osteoarthritis.  He has had profoundly restricted motion for many years with underlying significant rotator cuff dysfunction.  Due to his increasing pain and functional rotations and failure to respond to prolonged attempts at conservative management, he is brought to the operating this time for planned right shoulder reverse arthroplasty.  Preoperatively, I counseled the patient regarding treatment options and risks versus benefits thereof.  Possible surgical complications were all reviewed including potential for bleeding, infection, neurovascular injury, persistent pain, loss of motion, anesthetic complication, failure of the implant, and possible need for additional surgery. They understand and accept and agrees with our planned procedure.   Procedure in detail:  After undergoing routine preop evaluation the patient received prophylactic antibiotics and interscalene block with Exparel was established in the holding area by the anesthesia  department.  Patient was subsequently placed supine on the operating table and underwent the smooth induction of general endotracheal anesthesia.  Placed into the beachchair position and appropriately padded and protected.  The right shoulder girdle region was sterilely prepped and draped in standard fashion.  Timeout was called.  A deltopectoral approach to the right shoulder was made through an approximately 10 cm incision along the previous anterior incision.  Skin flaps were elevated and electrocautery was used for hemostasis.  We then identified the coracoid process and developed the deltopectoral interval which was significantly scarred fied related to the prior surgery and there was no dominant vein or interval and so this was a combination blunt sharp dissection to develop the deltopectoral interval.  We then divided the adhesions beneath the deltoid and then mobilized the conjoined tendon retracted this medially.  The long head biceps tendon was then tenodesed at the upper border the pectoralis major tendon with proximal segment unroofed and excised and rotator cuff was then split along the rotator interval to the base of the coracoid and the subscapularis was then separated from the lesser tuberosity using electrocautery and then tagged with a pair of suture tape sutures.  Capsular attachments on the anterior and infra margins of the humeral neck were then divided in a subperiosteal fashion allowing deliver the humeral head through the wound.  An extra medullary guide was then used to outline our proposed humeral head resection which we performed an oscillating saw at approximate 20 degrees retroversion.  Osteophytes in the margin of the humeral neck were then removed with a rondure.  A metal cap was then placed over the cut proximal humeral surface and we then exposed the glenoid with appropriate retractors.  A circumferential labral resection was then completed and also identified a large osteochondral  loose body attached to the capsule inferiorly which we carefully removed.  There were number of other loose bodies removed during the dissection.  At this point a guidepin was then directed into the center of the glenoid with an approximately 10 degree inferior tilt and the glenoid was then reamed with the central followed by the peripheral reamers and prepared with the central drill and tap with a 30 mm lag screw.  Our baseplate was then assembled and vancomycin powder applied to the threads of the lag screw the baseplate was then inserted with excellent purchase and fixation.  All of the peripheral locking screws then placed using standard technique with excellent purchase and fixation.  A 39/+4 glenosphere was then impacted onto the baseplate and the central locking screw was placed.  We returned our attention back to the proximal humerus where the canal was opened hand reaming and ultimately broaching up to a size 9 at approximately 20 degrees of retroversion.  A neutral metaphyseal reaming guide was then placed in the metaphysis was then prepared.  A trial implant was placed which showed good motion good stability good soft tissue balance.  The trial was then removed and the final implant was then impacted after we did place vancomycin powder liberally into the humeral canal.  We then performed a series of trial reductions and ultimately felt that the +6 polyethylene insert gave [place] the best soft tissue balance with good motion good stability.  Trial polyp was removed the implant was cleaned and dried the final polyethylene insert was impacted and final reduction then showed excellent motion good stability good soft tissue balance.  The joint was then copiously irrigated.  Final hemostasis was obtained.  We then confirmed good mobility and elasticity of the subscapularis and it was subsequently repaired back to the eyelets on the collar of our implant using the previously placed suture tape sutures.  The arm  then easily achieved 45 degrees of external rotation without excessive tension on the subscap repair.  The wound was again irrigated hemostasis was obtained.  The balance of the vancomycin powder was then placed liberally throughout the deep soft tissue planes.  The deltopectoral interval was reapproximated with a series of figure-of-eight number Vicryl sutures.  2-0 Monocryl used for the subcu layer and intracuticular 3-0 Monocryl for the skin followed by Dermabond and Aquacel dressing.  Right arm was placed in the sling.  Patient was awakened, extubated, and taken to the recovery in stable condition.  Jenetta Loges, PA-C was utilized as an Environmental consultant throughout this case, essential for help with positioning the patient, positioning extremity, tissue manipulation, implantation of the prosthesis, suture management, wound closure, and intraoperative decision-making.  Marin Shutter MD   Contact # [phone]

## 2020-08-17 NOTE — Anesthesia Procedure Notes (Signed)
Anesthesia Regional Block: Interscalene brachial plexus block   Pre-Anesthetic Checklist: , timeout performed,  Correct Patient, Correct Site, Correct Laterality,  Correct Procedure, Correct Position, site marked,  Risks and benefits discussed,  Surgical consent,  Pre-op evaluation,  At surgeon's request and post-op pain management  Laterality: Right  Prep: chloraprep       Needles:  Injection technique: Single-shot  Needle Type: Echogenic Stimulator Needle     Needle Length: 10cm  Needle Gauge: 20     Additional Needles:   Procedures:,,,, ultrasound used (permanent image in chart),,    Narrative:  Start time: 08/17/2020 6:45 AM End time: 08/17/2020 6:50 AM Injection made incrementally with aspirations every 5 mL.  Performed by: Personally  Anesthesiologist: Merlinda Frederick, MD  Additional Notes: Functioning IV was confirmed and monitors applied.  A 25m 22ga echogenic arrow stimulator was used. Sterile prep and drape,hand hygiene and sterile gloves were used.Ultrasound guidance: relevant anatomy identified, needle position confirmed, local anesthetic spread visualized around nerve(s)., vascular puncture avoided.  Image printed for medical record.  Negative aspiration and negative test dose prior to incremental administration of local anesthetic. The patient tolerated the procedure well.

## 2020-08-17 NOTE — Evaluation (Signed)
Occupational Therapy Evaluation Patient Details Name: Gregory Norton MRN: [id number] DOB: 04-Oct-1950 Today's Date: 08/17/2020    History of Present Illness Patient is a 70 year old male s/p R reverse total shoulder arthroplasty. PMH includes foot surgery, hip surgery, prostate CA   Clinical Impression   Patient is a 70 year old male s/p shoulder replacement without functional use of right dominant upper extremity secondary to effects of surgery and interscalene block and shoulder precautions. Therapist provided education and instruction to patient in regards to exercises, precautions, positioning, donning upper extremity clothing and bathing while maintaining shoulder precautions, ice and edema management and donning/doffing sling. Patient verbalized understanding and demonstrated as needed. Patient needed assistance to donn shirt and provided with instruction on compensatory strategies to perform ADLs. Patient to follow up with MD for further therapy needs.      Follow Up Recommendations  Follow surgeon's recommendation for DC plan and follow-up therapies    Equipment Recommendations  None recommended by OT       Precautions / Restrictions Precautions Precautions: Shoulder Type of Shoulder Precautions: If sitting in controlled environment, ok to come out of sling to give neck a break. Please sleep in it to protect until follow up in office. OK to use operative arm for feeding, hygiene and ADLs. Ok to instruct Pendulums and lap slides as exercises. Ok to use operative arm within the following parameters for ADL purposes. Ok for PROM, AAROM, AROM within pain tolerance and within the following ROM ER 20 ABD 45 FE 60 AROM elbow, wrist, hand ok Shoulder Interventions: Shoulder sling/immobilizer;Off for dressing/bathing/exercises Precaution Booklet Issued: Yes (comment) Required Braces or Orthoses: Sling Restrictions Weight Bearing Restrictions: Yes RUE Weight Bearing: Non weight bearing       Mobility Bed Mobility                    Transfers Overall transfer level: Independent                    Balance Overall balance assessment: Independent                                         ADL either performed or assessed with clinical judgement   ADL Overall ADL's : Needs assistance/impaired     Grooming: Wash/dry hands;Independent;Standing   Upper Body Bathing: Minimal assistance;Sitting   Lower Body Bathing: Independent;Sitting/lateral leans;Sit to/from stand   Upper Body Dressing : Minimal assistance;Cueing for sequencing;Sitting Upper Body Dressing Details (indicate cue type and reason): to thread R UE Lower Body Dressing: Independent;Sitting/lateral leans;Sit to/from stand   Toilet Transfer: Independent;Ambulation   Toileting- Clothing Manipulation and Hygiene: Independent;Sit to/from stand       Functional mobility during ADLs: Independent General ADL Comments: educated patient in compensatory strategies for self care tasks in order to maintain shoulder precautions.      Pertinent Vitals/Pain Pain Assessment: Faces Faces Pain Scale: Hurts a little bit Pain Location: R UE Pain Descriptors / Indicators: Heaviness;Numbness Pain Intervention(s): Monitored during session     Hand Dominance Right   Extremity/Trunk Assessment Upper Extremity Assessment Upper Extremity Assessment: RUE deficits/detail RUE Deficits / Details: + nerve block   Lower Extremity Assessment Lower Extremity Assessment: Overall WFL for tasks assessed   Cervical / Trunk Assessment Cervical / Trunk Assessment: Normal   Communication Communication Communication: No difficulties   Cognition Arousal/Alertness: Awake/alert Behavior During  Therapy: WFL for tasks assessed/performed Overall Cognitive Status: Within Functional Limits for tasks assessed                                        Exercises Exercises: Shoulder    Shoulder Instructions Shoulder Instructions Donning/doffing shirt without moving shoulder: Minimal assistance;Patient able to independently direct caregiver Method for sponge bathing under operated UE: Minimal assistance;Patient able to independently direct caregiver Donning/doffing sling/immobilizer: Moderate assistance;Patient able to independently direct caregiver Correct positioning of sling/immobilizer: Patient able to independently direct caregiver Pendulum exercises (written home exercise program): Patient able to independently direct caregiver ROM for elbow, wrist and digits of operated UE: Patient able to independently direct caregiver Sling wearing schedule (on at all times/off for ADL's): Patient able to independently direct caregiver Proper positioning of operated UE when showering: Patient able to independently direct caregiver Dressing change:  (N/A) Positioning of UE while sleeping: Patient able to independently direct caregiver    Home Living Family/patient expects to be discharged to:: Private residence Living Arrangements: Spouse/significant other Available Help at Discharge: Family Type of Home: House Home Access: Level entry     Home Layout: One level     Bathroom Shower/Tub: Occupational psychologist: Handicapped height Bathroom Accessibility: Yes   Home Equipment: Grab bars - toilet;Grab bars - tub/shower;Walker - 2 wheels;Cane - single point;Crutches;Shower seat;Bedside commode          Prior Functioning/Environment Level of Independence: Independent                 OT Problem List: Pain;Impaired UE functional use;Decreased knowledge of precautions         OT Goals(Current goals can be found in the care plan section) Acute Rehab OT Goals Patient Stated Goal: home OT Goal Formulation: All assessment and education complete, DC therapy   AM-PAC OT "6 Clicks" Daily Activity     Outcome Measure Help from another person eating meals?:  None Help from another person taking care of personal grooming?: None Help from another person toileting, which includes using toliet, bedpan, or urinal?: None Help from another person bathing (including washing, rinsing, drying)?: A Little Help from another person to put on and taking off regular upper body clothing?: A Little Help from another person to put on and taking off regular lower body clothing?: None 6 Click Score: 22   End of Session Equipment Utilized During Treatment: Other (comment) (sling) Nurse Communication: Other (comment) (OT complete)  Activity Tolerance: Patient tolerated treatment well Patient left: in chair;with call bell/phone within reach  OT Visit Diagnosis: Pain Pain - Right/Left: Right Pain - part of body: Shoulder                Time: 1108-1140 OT Time Calculation (min): 32 min Charges:  OT General Charges $OT Visit: 1 Visit OT Evaluation $OT Eval Low Complexity: 1 Low OT Treatments $Self Care/Home Management : 8-22 mins  Delbert Phenix OT OT pager: [place] 08/17/2020, 12:02 PM

## 2020-08-17 NOTE — Transfer of Care (Signed)
Immediate Anesthesia Transfer of Care Note  Patient: Gregory Norton  Procedure(s) Performed: REVERSE SHOULDER ARTHROPLASTY (Right: Shoulder)  Patient Location: PACU  Anesthesia Type:GA combined with regional for post-op pain  Level of Consciousness: awake, alert , oriented and patient cooperative  Airway & Oxygen Therapy: Patient Spontanous Breathing and Patient connected to face mask oxygen  Post-op Assessment: Report given to RN and Post -op Vital signs reviewed and stable  Post vital signs: Reviewed and stable  Last Vitals:  Vitals Value Taken Time  BP    Temp    Pulse 80 08/17/20 0935  Resp 16 08/17/20 0935  SpO2 95 % 08/17/20 0935  Vitals shown include unvalidated device data.  Last Pain:  Vitals:   08/17/20 0614  TempSrc:   PainSc: 6       Patients Stated Pain Goal: 4 ([id number])  Complications: No notable events documented.

## 2020-08-17 NOTE — Anesthesia Procedure Notes (Signed)
Procedure Name: Intubation Date/Time: 08/17/2020 7:39 AM Performed by: Lollie Sails, CRNA Pre-anesthesia Checklist: Patient identified, Emergency Drugs available, Suction available, Patient being monitored and Timeout performed Patient Re-evaluated:Patient Re-evaluated prior to induction Oxygen Delivery Method: Circle system utilized Preoxygenation: Pre-oxygenation with 100% oxygen Induction Type: IV induction Ventilation: Mask ventilation without difficulty Laryngoscope Size: Miller and 3 Grade View: Grade I Tube type: Oral Tube size: 8.0 mm Number of attempts: 1 Airway Equipment and Method: Stylet Placement Confirmation: ETT inserted through vocal cords under direct vision, positive ETCO2 and breath sounds checked- equal and bilateral Secured at: 23 cm Tube secured with: Tape Dental Injury: Teeth and Oropharynx as per pre-operative assessment  Comments: External laryngeal pressure applied to neck to maximize view.

## 2020-08-17 NOTE — Anesthesia Preprocedure Evaluation (Signed)
Anesthesia Evaluation  Patient identified by MRN, date of birth, ID band Patient awake    Reviewed: Allergy & Precautions, NPO status , Patient's Chart, lab work & pertinent test results  Airway Mallampati: II  TM Distance: >3 FB Neck ROM: Full    Dental no notable dental hx.    Pulmonary sleep apnea , former smoker,    Pulmonary exam normal        Cardiovascular Exercise Tolerance: Good hypertension, Pt. on medications Normal cardiovascular exam     Neuro/Psych    GI/Hepatic GERD  Medicated and Controlled,  Endo/Other    Renal/GU      Musculoskeletal  (+) Arthritis , Osteoarthritis,    Abdominal   Peds  Hematology   Anesthesia Other Findings   Reproductive/Obstetrics                             Anesthesia Physical  Anesthesia Plan  ASA: 3  Anesthesia Plan: General   Post-op Pain Management:  Regional for Post-op pain and GA combined w/ Regional for post-op pain   Induction: Intravenous  PONV Risk Score and Plan: 2 and Ondansetron, Midazolam, Treatment may vary due to age or medical condition and Dexamethasone  Airway Management Planned: Oral ETT  Additional Equipment: None  Intra-op Plan:   Post-operative Plan: Extubation in OR  Informed Consent: I have reviewed the patients History and Physical, chart, labs and discussed the procedure including the risks, benefits and alternatives for the proposed anesthesia with the patient or authorized representative who has indicated his/her understanding and acceptance.     Dental advisory given  Plan Discussed with: CRNA, Surgeon and Anesthesiologist  Anesthesia Plan Comments:         Anesthesia Quick Evaluation

## 2020-08-20 ENCOUNTER — Encounter (HOSPITAL_COMMUNITY): Payer: Self-pay | Admitting: Orthopedic Surgery

## 2020-11-23 NOTE — H&P (Signed)
TOTAL HIP REVISION ADMISSION H&P  Patient is admitted for left revision total hip arthroplasty.  Subjective:  Chief Complaint: left hip recurrent dislocation  HPI: Gregory Norton, 70 y.o. male, has a history of pain and functional disability in the left hip due to  left hip dislocation .The indications for the revision total hip arthroplasty are fracture or mechanical failure of one or more component.  He presented to our office with complaints of left hip instability and was found to have a left hip dislocation. He most recently had an acetabular revision in 2021, during which the acetabular liner was cemented into the shell. Based on x-ray, it is difficult to tell whether the liner has dissociated from the shell during the dislocation. Based on the complicated nature of his hip components, closed reduction was not pursued. Dr. Alvan Dame reviewed his condition, as well as proposed treatment with left total hip revision with him and his family. They reviewed risks, benefits, and expectations and he does wish to proceed. This condition presents safety issues increasing the risk of falls. There is no current active infection.  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   Past Medical History:  Diagnosis Date   Arthritis    Cancer ([place]) 2014   prostate cancer   Complication of anesthesia    low O2 sats, could not wake up after hip surgery   GERD (gastroesophageal reflux disease)    Hypercholesteremia    Hypertension    Osteoporosis    Pre-diabetes    Sleep apnea    no CPAP    Past Surgical History:  Procedure Laterality Date   ACETABULAR REVISION Left 01/01/2014   Procedure: ACETABULAR REVISION;  Surgeon: [name], MD;  Location: [facility];  Service: Orthopedics;  Laterality: Left;   APPENDECTOMY     FOOT SURGERY Bilateral 10 years ago    6 surgeries after crushing both feet   HIP OPEN REDUCTION Left    HIP SURGERY Right at 70 years old   JOINT REPLACEMENT     PROSTATECTOMY  March 2014   REVERSE SHOULDER ARTHROPLASTY Right 08/17/2020   Procedure: REVERSE SHOULDER ARTHROPLASTY;  Surgeon: Justice Britain, MD;  Location: [facility];  Service: Orthopedics;  Laterality: Right;   SHOULDER SURGERY Right at 70 years old   TOTAL HIP REVISION Left 07/20/2012   Procedure: LEFT TOTAL HIP REVISION;  Surgeon: [name], MD;  Location: [facility];  Service: Orthopedics;  Laterality: Left;   TOTAL HIP REVISION Left 11/18/2019   Procedure: POSTERIOR TOTAL HIP REVISION;  Surgeon: Paralee Cancel, MD;  Location: [facility];  Service: Orthopedics;  Laterality: Left;  90 mins    No current facility-administered medications for this encounter.   Current Outpatient Medications  Medication Sig Dispense Refill Last Dose   amLODipine (NORVASC) 5 MG tablet Take 5 mg by mouth daily.      methocarbamol (ROBAXIN) 500 MG tablet Take 1 tablet (500 mg total) by mouth every 6 (six) hours as needed for muscle spasms. (Patient taking differently: Take 1,000 mg by mouth at bedtime as needed for muscle spasms.) 40 tablet 0    pantoprazole (PROTONIX) 40 MG tablet Take 40 mg by mouth every morning.       simvastatin (ZOCOR) 10 MG tablet Take 10 mg by mouth every evening.  ascorbic acid (VITAMIN C) 500 MG tablet Take 500 mg by mouth daily.      docusate sodium (COLACE) 100 MG capsule Take 1 capsule (100 mg total) by mouth 2 (two) times daily. (Patient not taking: No sig reported) 28 capsule 0 Not Taking   Multiple Vitamin (MULTIVITAMIN WITH MINERALS) TABS Take 1 tablet by mouth every evening.       naproxen (NAPROSYN) 500 MG tablet Take 1 tablet (500 mg total) by mouth 2 (two) times daily with a meal. (Patient not taking: No sig reported) 60 tablet 1 Completed Course   ondansetron (ZOFRAN) 4 MG tablet Take 1 tablet (4 mg total) by mouth every 8 (eight) hours as needed for  nausea or vomiting. (Patient not taking: No sig reported) 10 tablet 0 Completed Course   oxyCODONE-acetaminophen (PERCOCET) 5-325 MG tablet Take 1 tablet by mouth every 4 (four) hours as needed (max 6 q). (Patient not taking: No sig reported) 20 tablet 0 Completed Course   polyethylene glycol (MIRALAX / GLYCOLAX) 17 g packet Take 17 g by mouth 2 (two) times daily. (Patient not taking: No sig reported) 28 packet 0 Not Taking   No Known Allergies  Social History   Tobacco Use   Smoking status: Former    Years: 15.00    Types: Cigarettes    Quit date: 01/14/1978    Years since quitting: 42.8   Smokeless tobacco: Never  Substance Use Topics   Alcohol use: Yes    Comment: social    No family history on file.    Review of Systems  Constitutional:  Negative for chills and fever.  Respiratory:  Negative for cough and shortness of breath.   Cardiovascular:  Negative for chest pain.  Gastrointestinal:  Negative for nausea and vomiting.  Musculoskeletal:  Positive for arthralgias.    Objective:  Physical Exam Very pleasant 70 year old male awake alert and oriented. He is in no acute distress. He is in a wheelchair for office travel. He is otherwise been using crutches.  Left hip exam: Based on the radiographs obtained today and his presentation I did not move his hip around.  He has neurovascular intact in left lower extremity  Vital signs in last 24 hours:    Labs:   Estimated body mass index is 36.94 kg/m as calculated from the following:   Height as of 08/17/20: 6\' 1"  (1.854 m).   Weight as of 08/17/20: 127 kg.  Imaging Review:  Imaging: AP pelvis and lateral of the left hip were ordered and reviewed. These show evidence of a dislocated left hip. Based on his surgical history and the fact that I had cemented polyethylene liners I am uncertain whether or not he has dislocated from the liner itself or dissociated the cemented liner from the shell.    Assessment/Plan:  Left  hip dislocation, left hip(s) with failed previous arthroplasty.  The patient history, physical examination, clinical judgement of the provider and imaging studies are consistent with dislocation of the left hip(s), previous total hip arthroplasty. Revision total hip arthroplasty is deemed medically necessary. The treatment options including medical management, injection therapy, arthroscopy and arthroplasty were discussed at length. The risks and benefits of total hip arthroplasty were presented and reviewed. The risks due to aseptic loosening, infection, stiffness, dislocation/subluxation,  thromboembolic complications and other imponderables were discussed.  The patient acknowledged the explanation, agreed to proceed with the plan and consent was signed. Patient is being admitted for inpatient treatment for surgery, pain control, PT, OT,  prophylactic antibiotics, VTE prophylaxis, progressive ambulation and ADL's and discharge planning. The patient is planning to be discharged  home.  Griffith Citron, PA-C Orthopedic Surgery [facility] [place] [phone]

## 2020-11-23 NOTE — Patient Instructions (Signed)
DUE TO COVID-19 ONLY ONE VISITOR IS ALLOWED TO COME WITH YOU AND STAY IN THE WAITING ROOM ONLY DURING PRE OP AND PROCEDURE.   **NO VISITORS ARE ALLOWED IN THE SHORT STAY AREA OR RECOVERY ROOM!!**  IF YOU WILL BE ADMITTED INTO THE HOSPITAL YOU ARE ALLOWED ONLY TWO SUPPORT PEOPLE DURING VISITATION HOURS ONLY (7 AM -8PM)   The support person(s) must pass our screening, gel in and out, and wear a mask at all times, including in the patient's room. Patients must also wear a mask when staff or their support person are in the room. Visitors GUEST BADGE MUST BE WORN VISIBLY  One adult visitor may remain with you overnight and MUST be in the room by 8 P.M.  No visitors under the age of 42. Any visitor under the age of 75 must be accompanied by an adult.    COVID SWAB TESTING MUST BE COMPLETED ON: Today, Nov. 11, 2022  **MUST PRESENT COMPLETED FORM AT TESTING SITE**    [facility] [place] (backside of the building) You are not required to quarantine, however you are required to wear a well-fitted mask when you are out and around people not in your household.  Hand Hygiene often Do NOT share personal items Notify your provider if you are in close contact with someone who has COVID or you develop fever 100.4 or greater, new onset of sneezing, cough, sore throat, shortness of breath or body aches.   Your procedure is scheduled on: Tuesday, Nov. 15, 2022   Report to [facility] Main Entrance    Report to admitting at 1:45 PM   Call this number if you have problems the morning of surgery [phone]   Bring CPAP Mask and Tubing Day of Procedure   Do not eat food :After Midnight.   May have liquids until  1:30 PM  day of surgery  CLEAR LIQUID DIET  Foods Allowed                                                                     Foods Excluded  Water, Black Coffee and tea, regular and decaf                             liquids that you cannot  Plain Jell-O in any flavor   (No red)                                           see through such as: Fruit ices (not with fruit pulp)                                     milk, soups, orange juice              Iced Popsicles (No red)                                    All solid  food                                   Apple juices Sports drinks like Gatorade (No red) Lightly seasoned clear broth or consume(fat free) Sugar  Sample Menu Breakfast                                Lunch                                     Supper Cranberry juice                    Beef broth                            Chicken broth Jell-O                                     Grape juice                           Apple juice Coffee or tea                        Jell-O                                      Popsicle                                                Coffee or tea                        Coffee or tea          The day of surgery:  Drink ONE (1) Pre-Surgery Clear Ensure at 1:30 pm the day of surgery. Drink in one sitting. Do not sip.  This drink was given to you during your hospital  pre-op appointment visit. Nothing else to drink after completing the  Pre-Surgery Clear Ensure.          If you have questions, please contact your surgeon's office.     Oral Hygiene is also important to reduce your risk of infection.                                    Remember - BRUSH YOUR TEETH THE MORNING OF SURGERY WITH YOUR REGULAR TOOTHPASTE   Do NOT smoke after Midnight   Take these medicines the morning of surgery with A SIP OF WATER: Amlodipine, Pantoprazole                              You may not have any metal on your body including jewelry, and body piercing  Do not wear lotions, powders, perfumes/cologne, or deodorant              Men may shave face and neck.   Do not bring valuables to the hospital. [place].   Contacts, dentures or bridgework may not be worn into  surgery.   Bring small overnight bag day of surgery.    Patients discharged on the day of surgery will not be allowed to drive home.   Special Instructions: Bring a copy of your healthcare power of attorney and living will documents         the day of surgery if you haven't scanned them before.              Please read over the following fact sheets you were given: IF YOU HAVE QUESTIONS ABOUT YOUR PRE-OP INSTRUCTIONS PLEASE CALL [phone]     [facility] - Preparing for Surgery Before surgery, you can play an important role.  Because skin is not sterile, your skin needs to be as free of germs as possible.  You can reduce the number of germs on your skin by washing with CHG (chlorahexidine gluconate) soap before surgery.  CHG is an antiseptic cleaner which kills germs and bonds with the skin to continue killing germs even after washing. Please DO NOT use if you have an allergy to CHG or antibacterial soaps.  If your skin becomes reddened/irritated stop using the CHG and inform your nurse when you arrive at Short Stay. Do not shave (including legs and underarms) for at least 48 hours prior to the first CHG shower.  You may shave your face/neck.  Please follow these instructions carefully:  1.  Shower with CHG Soap the night before surgery and the  morning of surgery.  2.  If you choose to wash your hair, wash your hair first as usual with your normal  shampoo.  3.  After you shampoo, rinse your hair and body thoroughly to remove the shampoo.                             4.  Use CHG as you would any other liquid soap.  You can apply chg directly to the skin and wash.  Gently with a scrungie or clean washcloth.  5.  Apply the CHG Soap to your body ONLY FROM THE NECK DOWN.   Do   not use on face/ open                           Wound or open sores. Avoid contact with eyes, ears mouth and   genitals (private parts).                       Wash face,  Genitals (private parts) with your normal soap.              6.  Wash thoroughly, paying special attention to the area where your    surgery  will be performed.  7.  Thoroughly rinse your body with warm water from the neck down.  8.  DO NOT shower/wash with your normal soap after using and rinsing off the CHG Soap.                9.  Pat yourself  dry with a clean towel.            10.  Wear clean pajamas.            11.  Place clean sheets on your bed the night of your first shower and do not  sleep with pets. Day of Surgery : Do not apply any lotions/deodorants the morning of surgery.  Please wear clean clothes to the hospital/surgery center.  FAILURE TO FOLLOW THESE INSTRUCTIONS MAY RESULT IN THE CANCELLATION OF YOUR SURGERY  PATIENT SIGNATURE_________________________________  NURSE SIGNATURE__________________________________  ________________________________________________________________________   Adam Phenix  An incentive spirometer is a tool that can help keep your lungs clear and active. This tool measures how well you are filling your lungs with each breath. Taking long deep breaths may help reverse or decrease the chance of developing breathing (pulmonary) problems (especially infection) following: A long period of time when you are unable to move or be active. BEFORE THE PROCEDURE  If the spirometer includes an indicator to show your best effort, your nurse or respiratory therapist will set it to a desired goal. If possible, sit up straight or lean slightly forward. Try not to slouch. Hold the incentive spirometer in an upright position. INSTRUCTIONS FOR USE  Sit on the edge of your bed if possible, or sit up as far as you can in bed or on a chair. Hold the incentive spirometer in an upright position. Breathe out normally. Place the mouthpiece in your mouth and seal your lips tightly around it. Breathe in slowly and as deeply as possible, raising the piston or the ball toward the top of the column. Hold your breath  for 3-5 seconds or for as long as possible. Allow the piston or ball to fall to the bottom of the column. Remove the mouthpiece from your mouth and breathe out normally. Rest for a few seconds and repeat Steps 1 through 7 at least 10 times every 1-2 hours when you are awake. Take your time and take a few normal breaths between deep breaths. The spirometer may include an indicator to show your best effort. Use the indicator as a goal to work toward during each repetition. After each set of 10 deep breaths, practice coughing to be sure your lungs are clear. If you have an incision (the cut made at the time of surgery), support your incision when coughing by placing a pillow or rolled up towels firmly against it. Once you are able to get out of bed, walk around indoors and cough well. You may stop using the incentive spirometer when instructed by your caregiver.  RISKS AND COMPLICATIONS Take your time so you do not get dizzy or light-headed. If you are in pain, you may need to take or ask for pain medication before doing incentive spirometry. It is harder to take a deep breath if you are having pain. AFTER USE Rest and breathe slowly and easily. It can be helpful to keep track of a log of your progress. Your caregiver can provide you with a simple table to help with this. If you are using the spirometer at home, follow these instructions: [place] IF:  You are having difficultly using the spirometer. You have trouble using the spirometer as often as instructed. Your pain medication is not giving enough relief while using the spirometer. You develop fever of 100.5 F (38.1 C) or higher. SEEK IMMEDIATE MEDICAL CARE IF:  You cough up bloody sputum that had not been present before.  You develop fever of 102 F (38.9 C) or greater. You develop worsening pain at or near the incision site. MAKE SURE YOU:  Understand these instructions. Will watch your condition. Will get help right away if  you are not doing well or get worse. Document Released: 05/13/2006 Document Revised: 03/25/2011 Document Reviewed: 07/14/2006 [employer] Patient Information 2014 [employer], [place].   ________________________________________________________________________  WHAT IS A BLOOD TRANSFUSION? Blood Transfusion Information  A transfusion is the replacement of blood or some of its parts. Blood is made up of multiple cells which provide different functions. Red blood cells carry oxygen and are used for blood loss replacement. White blood cells fight against infection. Platelets control bleeding. Plasma helps clot blood. Other blood products are available for specialized needs, such as hemophilia or other clotting disorders. BEFORE THE TRANSFUSION  Who gives blood for transfusions?  Healthy volunteers who are fully evaluated to make sure their blood is safe. This is blood bank blood. Transfusion therapy is the safest it has ever been in the practice of medicine. Before blood is taken from a donor, a complete history is taken to make sure that person has no history of diseases nor engages in risky social behavior (examples are intravenous drug use or sexual activity with multiple partners). The donor's travel history is screened to minimize risk of transmitting infections, such as malaria. The donated blood is tested for signs of infectious diseases, such as HIV and hepatitis. The blood is then tested to be sure it is compatible with you in order to minimize the chance of a transfusion reaction. If you or a relative donates blood, this is often done in anticipation of surgery and is not appropriate for emergency situations. It takes many days to process the donated blood. RISKS AND COMPLICATIONS Although transfusion therapy is very safe and saves many lives, the main dangers of transfusion include:  Getting an infectious disease. Developing a transfusion reaction. This is an allergic reaction to something in the  blood you were given. Every precaution is taken to prevent this. The decision to have a blood transfusion has been considered carefully by your caregiver before blood is given. Blood is not given unless the benefits outweigh the risks. AFTER THE TRANSFUSION Right after receiving a blood transfusion, you will usually feel much better and more energetic. This is especially true if your red blood cells have gotten low (anemic). The transfusion raises the level of the red blood cells which carry oxygen, and this usually causes an energy increase. The nurse administering the transfusion will monitor you carefully for complications. HOME CARE INSTRUCTIONS  No special instructions are needed after a transfusion. You may find your energy is better. Speak with your caregiver about any limitations on activity for underlying diseases you may have. SEEK MEDICAL CARE IF:  Your condition is not improving after your transfusion. You develop redness or irritation at the intravenous (IV) site. SEEK IMMEDIATE MEDICAL CARE IF:  Any of the following symptoms occur over the next 12 hours: Shaking chills. You have a temperature by mouth above 102 F (38.9 C), not controlled by medicine. Chest, back, or muscle pain. People around you feel you are not acting correctly or are confused. Shortness of breath or difficulty breathing. Dizziness and fainting. You get a rash or develop hives. You have a decrease in urine output. Your urine turns a dark color or changes to pink, red, or brown. Any of the following symptoms occur over the next 10 days: You have a temperature  by mouth above 102 F (38.9 C), not controlled by medicine. Shortness of breath. Weakness after normal activity. The white part of the eye turns yellow (jaundice). You have a decrease in the amount of urine or are urinating less often. Your urine turns a dark color or changes to pink, red, or brown. Document Released: 12/29/1999 Document Revised:  03/25/2011 Document Reviewed: 08/17/2007 [facility] Patient Information 2014 [place], [place].  _______________________________________________________________________

## 2020-11-24 ENCOUNTER — Other Ambulatory Visit: Payer: Self-pay

## 2020-11-24 ENCOUNTER — Encounter (HOSPITAL_COMMUNITY): Payer: Self-pay

## 2020-11-24 ENCOUNTER — Other Ambulatory Visit: Payer: Self-pay | Admitting: Orthopedic Surgery

## 2020-11-24 ENCOUNTER — Encounter (HOSPITAL_COMMUNITY)
Admission: RE | Admit: 2020-11-24 | Discharge: 2020-11-24 | Disposition: A | Discharge status: Home / Self Care | Payer: Medicare Other | Source: Ambulatory Visit | Attending: Orthopedic Surgery | Admitting: Orthopedic Surgery

## 2020-11-24 DIAGNOSIS — Z01812 Encounter for preprocedural laboratory examination: Secondary | ICD-10-CM | POA: Diagnosis not present

## 2020-11-24 DIAGNOSIS — Z79899 Other long term (current) drug therapy: Secondary | ICD-10-CM | POA: Insufficient documentation

## 2020-11-24 DIAGNOSIS — T84011D Broken internal left hip prosthesis, subsequent encounter: Secondary | ICD-10-CM

## 2020-11-24 DIAGNOSIS — Z01818 Encounter for other preprocedural examination: Secondary | ICD-10-CM

## 2020-11-24 LAB — COMPREHENSIVE METABOLIC PANEL
ALT: 24 U/L (ref 0–44)
AST: 18 U/L (ref 15–41)
Albumin: 4.1 g/dL (ref 3.5–5.0)
Alkaline Phosphatase: 54 U/L (ref 38–126)
Anion gap: 7 (ref 5–15)
BUN: 19 mg/dL (ref 8–23)
CO2: 28 mmol/L (ref 22–32)
Calcium: 8.9 mg/dL (ref 8.9–10.3)
Chloride: 103 mmol/L (ref 98–111)
Creatinine, Ser: 0.83 mg/dL (ref 0.61–1.24)
GFR, Estimated: >60 mL/min (ref >60)
Glucose, Bld: 102 mg/dL — ABNORMAL HIGH (ref 70–99)
Potassium: 4.4 mmol/L (ref 3.5–5.1)
Sodium: 138 mmol/L (ref 135–145)
Total Bilirubin: 0.7 mg/dL (ref 0.3–1.2)
Total Protein: 7.8 g/dL (ref 6.5–8.1)

## 2020-11-24 LAB — HEMOGLOBIN A1C
Hgb A1c MFr Bld: 5.5 % (ref 4.8–5.6)
Mean Plasma Glucose: 111.15 mg/dL

## 2020-11-24 LAB — CBC
HCT: 47.4 % (ref 39.0–52.0)
Hemoglobin: 15.9 g/dL (ref 13.0–17.0)
MCH: 30.9 pg (ref 26.0–34.0)
MCHC: 33.5 g/dL (ref 30.0–36.0)
MCV: 92 fL (ref 80.0–100.0)
Platelets: 268 10*3/uL (ref 150–400)
RBC: 5.15 MIL/uL (ref 4.22–5.81)
RDW: 13.4 % (ref 11.5–15.5)
WBC: 9.5 10*3/uL (ref 4.0–10.5)
nRBC: 0 % (ref 0.0–0.2)

## 2020-11-24 LAB — SURGICAL PCR SCREEN
MRSA, PCR: NEGATIVE
Staphylococcus aureus: NEGATIVE

## 2020-11-24 LAB — SARS CORONAVIRUS 2 (TAT 6-24 HRS): SARS Coronavirus 2: NEGATIVE

## 2020-11-24 NOTE — Patient Instructions (Addendum)
DUE TO COVID-19 ONLY ONE VISITOR IS ALLOWED TO COME WITH YOU AND STAY IN THE WAITING ROOM ONLY DURING PRE OP AND PROCEDURE DAY OF SURGERY IF YOU ARE GOING HOME AFTER SURGERY. IF YOU ARE SPENDING THE NIGHT 2 PEOPLE MAY VISIT WITH YOU IN YOUR PRIVATE ROOM AFTER SURGERY UNTIL VISITING  HOURS ARE OVER AT 800 PM AND 1  VISITOR  MAY  SPEND THE NIGHT.   YOU NEED TO HAVE A COVID 19 TEST ON_11/11______ @_______ , THIS TEST MUST BE DONE BEFORE SURGERY,  COVID TESTING SITE  IS LOCATED AT [place], [place]. REMAIN IN YOUR CAR THIS IS A DRIVE UP TEST. AFTER YOUR COVID TEST PLEASE WEAR A MASK OUT IN PUBLIC AND SOCIAL DISTANCE AND [place] YOUR HANDS FREQUENTLY, ALSO ASK ALL YOUR CLOSE CONTACT PERSONS TO WEAR A MASK AND SOCIAL DISTANCE AND [place] THEIR HANDS FREQUENTLY ALSO.                  Your procedure is scheduled on: 11/28/20   Report to [facility] Main  Entrance   Report to admitting at    1:45 AM     Call this number if you have problems the morning of surgery [phone]    Remember: Do not eat food  :After Midnight the night before your surgery,   You may have clear liquids from midnight until ---1:30 pm  Drink pre op drink at 1:30 PM  CLEAR LIQUID DIET   Foods Allowed                                                                     Foods Excluded  Coffee and tea, regular and decaf                             liquids that you cannot  Plain Jell-O any favor except red or purple                                           see through such as: Fruit ices (not with fruit pulp)                                     milk, soups, orange juice  Iced Popsicles                                    All solid food Carbonated beverages, regular and diet                                    Cranberry, grape and apple juices Sports drinks like Gatorade Lightly seasoned clear broth or consume(fat free) Sugar    BRUSH YOUR TEETH MORNING OF SURGERY AND RINSE YOUR MOUTH OUT, NO CHEWING  GUM CANDY OR MINTS.     Take these medicines the morning of surgery with A SIP  OF WATER: Amlodipine, Pantoprazole                                You may not have any metal on your body including              piercings  Do not wear jewelry, lotions, powders or  deodorant             Men may shave face and neck.   Do not bring valuables to the hospital. [place].  Contacts, dentures or bridgework may not be worn into surgery.  Leave suitcase in the car. After surgery it may be brought to your room.                  Please read over the following fact sheets you were given: _____________________________________________________________________             [facility] - Preparing for Surgery Before surgery, you can play an important role.  Because skin is not sterile, your skin needs to be as free of germs as possible.  You can reduce the number of germs on your skin by washing with CHG (chlorahexidine gluconate) soap before surgery.  CHG is an antiseptic cleaner which kills germs and bonds with the skin to continue killing germs even after washing. Please DO NOT use if you have an allergy to CHG or antibacterial soaps.  If your skin becomes reddened/irritated stop using the CHG and inform your nurse when you arrive at Short Stay. You may shave your face/neck. Please follow these instructions carefully:  1.  Shower with CHG Soap the night before surgery and the  morning of Surgery.  2.  If you choose to wash your hair, wash your hair first as usual with your  normal  shampoo.  3.  After you shampoo, rinse your hair and body thoroughly to remove the  shampoo.                            4.  Use CHG as you would any other liquid soap.  You can apply chg directly  to the skin and wash                       Gently with a scrungie or clean washcloth.  5.  Apply the CHG Soap to your body ONLY FROM THE NECK DOWN.   Do not use on face/ open                            Wound or open sores. Avoid contact with eyes, ears mouth and genitals (private parts).                       Wash face,  Genitals (private parts) with your normal soap.             6.  Wash thoroughly, paying special attention to the area where your surgery  will be performed.  7.  Thoroughly rinse your body with warm water from the neck down.  8.  DO NOT shower/wash with your normal soap after using and rinsing off  the CHG Soap.  9.  Pat yourself dry with a clean towel.            10.  Wear clean pajamas.            11.  Place clean sheets on your bed the night of your first shower and do not  sleep with pets. Day of Surgery : Do not apply any lotions/deodorants the morning of surgery.  Please wear clean clothes to the hospital/surgery center.  FAILURE TO FOLLOW THESE INSTRUCTIONS MAY RESULT IN THE CANCELLATION OF YOUR SURGERY PATIENT SIGNATURE_________________________________  NURSE SIGNATURE__________________________________  ________________________________________________________________________   Adam Phenix  An incentive spirometer is a tool that can help keep your lungs clear and active. This tool measures how well you are filling your lungs with each breath. Taking long deep breaths may help reverse or decrease the chance of developing breathing (pulmonary) problems (especially infection) following: A long period of time when you are unable to move or be active. BEFORE THE PROCEDURE  If the spirometer includes an indicator to show your best effort, your nurse or respiratory therapist will set it to a desired goal. If possible, sit up straight or lean slightly forward. Try not to slouch. Hold the incentive spirometer in an upright position. INSTRUCTIONS FOR USE  Sit on the edge of your bed if possible, or sit up as far as you can in bed or on a chair. Hold the incentive spirometer in an upright position. Breathe out normally. Place the  mouthpiece in your mouth and seal your lips tightly around it. Breathe in slowly and as deeply as possible, raising the piston or the ball toward the top of the column. Hold your breath for 3-5 seconds or for as long as possible. Allow the piston or ball to fall to the bottom of the column. Remove the mouthpiece from your mouth and breathe out normally. Rest for a few seconds and repeat Steps 1 through 7 at least 10 times every 1-2 hours when you are awake. Take your time and take a few normal breaths between deep breaths. The spirometer may include an indicator to show your best effort. Use the indicator as a goal to work toward during each repetition. After each set of 10 deep breaths, practice coughing to be sure your lungs are clear. If you have an incision (the cut made at the time of surgery), support your incision when coughing by placing a pillow or rolled up towels firmly against it. Once you are able to get out of bed, walk around indoors and cough well. You may stop using the incentive spirometer when instructed by your caregiver.  RISKS AND COMPLICATIONS Take your time so you do not get dizzy or light-headed. If you are in pain, you may need to take or ask for pain medication before doing incentive spirometry. It is harder to take a deep breath if you are having pain. AFTER USE Rest and breathe slowly and easily. It can be helpful to keep track of a log of your progress. Your caregiver can provide you with a simple table to help with this. If you are using the spirometer at home, follow these instructions: [place] IF:  You are having difficultly using the spirometer. You have trouble using the spirometer as often as instructed. Your pain medication is not giving enough relief while using the spirometer. You develop fever of 100.5 F (38.1 C) or higher. SEEK IMMEDIATE MEDICAL CARE IF:  You cough up bloody sputum that had not  been present before. You develop fever of 102 F  (38.9 C) or greater. You develop worsening pain at or near the incision site. MAKE SURE YOU:  Understand these instructions. Will watch your condition. Will get help right away if you are not doing well or get worse. Document Released: 05/13/2006 Document Revised: 03/25/2011 Document Reviewed: 07/14/2006 [facility] Patient Information 2014 [place], [place].   ________________________________________________________________________

## 2020-11-24 NOTE — Progress Notes (Signed)
COVID  test-11/24/20   PCP - Dr. Clarene Reamer Cardiologist - none  Chest x-ray - no EKG - 08/17/20-epic Stress Test - no ECHO - no Cardiac Cath - no Pacemaker/ICD device last checked:NA  Sleep Study - yes Pt no longer uses a C-PAP because he lost a lot of weight but his stop bang score was 5. Pt and wife adamantly deny sleep apnea CPAP - no  Fasting Blood Sugar - NA Checks Blood Sugar _____ times a day  Blood Thinner Instructions:NA Aspirin Instructions: Last Dose:  Anesthesia review: yes  Patient denies shortness of breath, fever, cough and chest pain at PAT appointment yes  Patient verbalized understanding of instructions that were given to them at the PAT appointment. Patient was also instructed that they will need to review over the PAT instructions again at home before surgery. yes

## 2020-11-28 ENCOUNTER — Inpatient Hospital Stay (HOSPITAL_COMMUNITY): Payer: Medicare Other | Admitting: Anesthesiology

## 2020-11-28 ENCOUNTER — Inpatient Hospital Stay (HOSPITAL_COMMUNITY)
Admission: RE | Admit: 2020-11-28 | Discharge: 2020-12-04 | DRG: 468 | Disposition: A | Discharge status: Home / Self Care | Payer: Medicare Other | Attending: Orthopedic Surgery | Admitting: Orthopedic Surgery

## 2020-11-28 ENCOUNTER — Encounter (HOSPITAL_COMMUNITY): Payer: Self-pay | Admitting: Orthopedic Surgery

## 2020-11-28 ENCOUNTER — Encounter (HOSPITAL_COMMUNITY)
Admission: RE | Disposition: A | Discharge status: Home / Self Care | Payer: Self-pay | Source: Home / Self Care | Attending: Orthopedic Surgery

## 2020-11-28 ENCOUNTER — Inpatient Hospital Stay (HOSPITAL_COMMUNITY): Payer: Medicare Other

## 2020-11-28 DIAGNOSIS — Z8546 Personal history of malignant neoplasm of prostate: Secondary | ICD-10-CM | POA: Diagnosis not present

## 2020-11-28 DIAGNOSIS — Z419 Encounter for procedure for purposes other than remedying health state, unspecified: Secondary | ICD-10-CM

## 2020-11-28 DIAGNOSIS — I10 Essential (primary) hypertension: Secondary | ICD-10-CM | POA: Diagnosis present

## 2020-11-28 DIAGNOSIS — T84021A Dislocation of internal left hip prosthesis, initial encounter: Principal | ICD-10-CM | POA: Diagnosis present

## 2020-11-28 DIAGNOSIS — Y792 Prosthetic and other implants, materials and accessory orthopedic devices associated with adverse incidents: Secondary | ICD-10-CM | POA: Diagnosis present

## 2020-11-28 DIAGNOSIS — Z6836 Body mass index (BMI) 36.0-36.9, adult: Secondary | ICD-10-CM

## 2020-11-28 DIAGNOSIS — R7303 Prediabetes: Secondary | ICD-10-CM | POA: Diagnosis present

## 2020-11-28 DIAGNOSIS — K219 Gastro-esophageal reflux disease without esophagitis: Secondary | ICD-10-CM | POA: Diagnosis present

## 2020-11-28 DIAGNOSIS — M25352 Other instability, left hip: Secondary | ICD-10-CM | POA: Diagnosis present

## 2020-11-28 DIAGNOSIS — Z87891 Personal history of nicotine dependence: Secondary | ICD-10-CM

## 2020-11-28 DIAGNOSIS — E7800 Pure hypercholesterolemia, unspecified: Secondary | ICD-10-CM | POA: Diagnosis present

## 2020-11-28 DIAGNOSIS — E669 Obesity, unspecified: Secondary | ICD-10-CM | POA: Diagnosis not present

## 2020-11-28 DIAGNOSIS — Z96649 Presence of unspecified artificial hip joint: Secondary | ICD-10-CM

## 2020-11-28 DIAGNOSIS — Z79899 Other long term (current) drug therapy: Secondary | ICD-10-CM | POA: Diagnosis not present

## 2020-11-28 DIAGNOSIS — T84011D Broken internal left hip prosthesis, subsequent encounter: Secondary | ICD-10-CM

## 2020-11-28 DIAGNOSIS — Z01818 Encounter for other preprocedural examination: Secondary | ICD-10-CM

## 2020-11-28 DIAGNOSIS — M25552 Pain in left hip: Secondary | ICD-10-CM

## 2020-11-28 DIAGNOSIS — M810 Age-related osteoporosis without current pathological fracture: Secondary | ICD-10-CM | POA: Diagnosis not present

## 2020-11-28 HISTORY — PX: TOTAL HIP REVISION: SHX763

## 2020-11-28 LAB — TYPE AND SCREEN
ABO/RH(D): A POS
Antibody Screen: NEGATIVE

## 2020-11-28 SURGERY — TOTAL HIP REVISION
Anesthesia: General | Site: Hip | Laterality: Left

## 2020-11-28 MED ORDER — METHOCARBAMOL 500 MG PO TABS
500.0000 mg | ORAL_TABLET | Freq: Four times a day (QID) | ORAL | Status: DC | PRN
  Administered 2020-11-29 – 2020-12-02 (×9): 500 mg via ORAL
  Filled 2020-11-28 (×9): qty 1

## 2020-11-28 MED ORDER — ONDANSETRON HCL 4 MG/2ML IJ SOLN
INTRAMUSCULAR | Status: AC
  Filled 2020-11-28: qty 2

## 2020-11-28 MED ORDER — METHOCARBAMOL 500 MG IVPB - SIMPLE MED
500.0000 mg | Freq: Four times a day (QID) | INTRAVENOUS | Status: DC | PRN
  Administered 2020-11-28: 500 mg via INTRAVENOUS
  Filled 2020-11-28: qty 50

## 2020-11-28 MED ORDER — FERROUS SULFATE 325 (65 FE) MG PO TABS
325.0000 mg | ORAL_TABLET | Freq: Three times a day (TID) | ORAL | Status: DC
  Administered 2020-11-29 – 2020-12-01 (×5): 325 mg via ORAL
  Filled 2020-11-28 (×5): qty 1

## 2020-11-28 MED ORDER — PHENYLEPHRINE 40 MCG/ML (10ML) SYRINGE FOR IV PUSH (FOR BLOOD PRESSURE SUPPORT)
PREFILLED_SYRINGE | INTRAVENOUS | Status: AC
  Filled 2020-11-28: qty 10

## 2020-11-28 MED ORDER — PANTOPRAZOLE SODIUM 40 MG PO TBEC
40.0000 mg | DELAYED_RELEASE_TABLET | Freq: Every morning | ORAL | Status: DC
  Administered 2020-12-01 – 2020-12-04 (×3): 40 mg via ORAL
  Filled 2020-11-28 (×3): qty 1

## 2020-11-28 MED ORDER — DOCUSATE SODIUM 100 MG PO CAPS
100.0000 mg | ORAL_CAPSULE | Freq: Two times a day (BID) | ORAL | Status: DC
  Administered 2020-11-29 – 2020-12-01 (×6): 100 mg via ORAL
  Filled 2020-11-28 (×6): qty 1

## 2020-11-28 MED ORDER — HYDROMORPHONE HCL 1 MG/ML IJ SOLN
INTRAMUSCULAR | Status: AC
  Filled 2020-11-28: qty 1

## 2020-11-28 MED ORDER — PROPOFOL 10 MG/ML IV BOLUS
INTRAVENOUS | Status: AC
  Filled 2020-11-28: qty 20

## 2020-11-28 MED ORDER — CEFAZOLIN SODIUM-DEXTROSE 2-4 GM/100ML-% IV SOLN
2.0000 g | INTRAVENOUS | Status: AC
  Administered 2020-11-28: 2 g via INTRAVENOUS
  Filled 2020-11-28: qty 100

## 2020-11-28 MED ORDER — DEXAMETHASONE SODIUM PHOSPHATE 10 MG/ML IJ SOLN: 10.0000 mg | Freq: Once | INTRAMUSCULAR | Status: DC

## 2020-11-28 MED ORDER — POLYETHYLENE GLYCOL 3350 17 G PO PACK: 17.0000 g | PACK | Freq: Every day | ORAL | Status: DC | PRN

## 2020-11-28 MED ORDER — SIMVASTATIN 20 MG PO TABS
10.0000 mg | ORAL_TABLET | Freq: Every day | ORAL | Status: DC
  Administered 2020-11-29 – 2020-12-03 (×6): 10 mg via ORAL
  Filled 2020-11-28 (×6): qty 1

## 2020-11-28 MED ORDER — LACTATED RINGERS IV SOLN: INTRAVENOUS | Status: DC

## 2020-11-28 MED ORDER — AMLODIPINE BESYLATE 5 MG PO TABS
5.0000 mg | ORAL_TABLET | Freq: Every day | ORAL | Status: DC
  Administered 2020-11-29 – 2020-12-03 (×6): 5 mg via ORAL
  Filled 2020-11-28 (×6): qty 1

## 2020-11-28 MED ORDER — SODIUM CHLORIDE 0.9 % IR SOLN
Status: DC | PRN
  Administered 2020-11-28: 3000 mL

## 2020-11-28 MED ORDER — ONDANSETRON HCL 4 MG/2ML IJ SOLN: 4.0000 mg | Freq: Four times a day (QID) | INTRAMUSCULAR | Status: DC | PRN

## 2020-11-28 MED ORDER — ASPIRIN 81 MG PO CHEW
81.0000 mg | CHEWABLE_TABLET | Freq: Two times a day (BID) | ORAL | Status: DC
  Administered 2020-11-29 – 2020-12-01 (×5): 81 mg via ORAL
  Filled 2020-11-28 (×5): qty 1

## 2020-11-28 MED ORDER — DEXAMETHASONE SODIUM PHOSPHATE 10 MG/ML IJ SOLN: 8.0000 mg | Freq: Once | INTRAMUSCULAR | Status: DC

## 2020-11-28 MED ORDER — HYDROMORPHONE HCL 1 MG/ML IJ SOLN
0.2500 mg | INTRAMUSCULAR | Status: DC | PRN
  Administered 2020-11-28 (×2): 0.5 mg via INTRAVENOUS

## 2020-11-28 MED ORDER — BISACODYL 10 MG RE SUPP: 10.0000 mg | Freq: Every day | RECTAL | Status: DC | PRN

## 2020-11-28 MED ORDER — 0.9 % SODIUM CHLORIDE (POUR BTL) OPTIME
TOPICAL | Status: DC | PRN
  Administered 2020-11-28: 1000 mL

## 2020-11-28 MED ORDER — MEPERIDINE HCL 50 MG/ML IJ SOLN: 6.2500 mg | INTRAMUSCULAR | Status: DC | PRN

## 2020-11-28 MED ORDER — SODIUM CHLORIDE 0.9 % IV SOLN: INTRAVENOUS | Status: DC

## 2020-11-28 MED ORDER — POVIDONE-IODINE 10 % EX SWAB
2.0000 "application " | Freq: Once | CUTANEOUS | Status: AC
  Administered 2020-11-28: 2 via TOPICAL

## 2020-11-28 MED ORDER — DEXAMETHASONE SODIUM PHOSPHATE 10 MG/ML IJ SOLN
INTRAMUSCULAR | Status: AC
  Filled 2020-11-28: qty 1

## 2020-11-28 MED ORDER — METOCLOPRAMIDE HCL 5 MG/ML IJ SOLN: 5.0000 mg | Freq: Three times a day (TID) | INTRAMUSCULAR | Status: DC | PRN

## 2020-11-28 MED ORDER — PHENOL 1.4 % MT LIQD: 1.0000 | OROMUCOSAL | Status: DC | PRN

## 2020-11-28 MED ORDER — OXYCODONE HCL 5 MG PO TABS
10.0000 mg | ORAL_TABLET | ORAL | Status: DC | PRN
  Administered 2020-11-30: 15 mg via ORAL
  Administered 2020-12-01: 10 mg via ORAL
  Administered 2020-12-01: 15 mg via ORAL
  Administered 2020-12-02: 10 mg via ORAL
  Filled 2020-11-28 (×3): qty 3

## 2020-11-28 MED ORDER — DIPHENHYDRAMINE HCL 12.5 MG/5ML PO ELIX: 12.5000 mg | ORAL_SOLUTION | ORAL | Status: DC | PRN

## 2020-11-28 MED ORDER — MENTHOL 3 MG MT LOZG: 1.0000 | LOZENGE | OROMUCOSAL | Status: DC | PRN

## 2020-11-28 MED ORDER — ORAL CARE MOUTH RINSE: 15.0000 mL | Freq: Once | OROMUCOSAL | Status: AC

## 2020-11-28 MED ORDER — ACETAMINOPHEN 325 MG PO TABS: 325.0000 mg | ORAL_TABLET | Freq: Four times a day (QID) | ORAL | Status: DC | PRN

## 2020-11-28 MED ORDER — STERILE WATER FOR IRRIGATION IR SOLN
Status: DC | PRN
  Administered 2020-11-28: 2000 mL

## 2020-11-28 MED ORDER — PROMETHAZINE HCL 25 MG/ML IJ SOLN: 6.2500 mg | INTRAMUSCULAR | Status: DC | PRN

## 2020-11-28 MED ORDER — ONDANSETRON HCL 4 MG PO TABS: 4.0000 mg | ORAL_TABLET | Freq: Four times a day (QID) | ORAL | Status: DC | PRN

## 2020-11-28 MED ORDER — PHENYLEPHRINE HCL-NACL 20-0.9 MG/250ML-% IV SOLN
INTRAVENOUS | Status: DC | PRN
  Administered 2020-11-28: 25 ug/min via INTRAVENOUS

## 2020-11-28 MED ORDER — CEFAZOLIN SODIUM-DEXTROSE 2-4 GM/100ML-% IV SOLN
2.0000 g | Freq: Four times a day (QID) | INTRAVENOUS | Status: AC
  Administered 2020-11-29 (×2): 2 g via INTRAVENOUS
  Filled 2020-11-28 (×2): qty 100

## 2020-11-28 MED ORDER — PROPOFOL 10 MG/ML IV BOLUS
INTRAVENOUS | Status: DC | PRN
  Administered 2020-11-28: 20 mg via INTRAVENOUS

## 2020-11-28 MED ORDER — HYDROMORPHONE HCL 1 MG/ML IJ SOLN: 0.5000 mg | INTRAMUSCULAR | Status: DC | PRN

## 2020-11-28 MED ORDER — METOCLOPRAMIDE HCL 5 MG PO TABS: 5.0000 mg | ORAL_TABLET | Freq: Three times a day (TID) | ORAL | Status: DC | PRN

## 2020-11-28 MED ORDER — ONDANSETRON HCL 4 MG/2ML IJ SOLN
INTRAMUSCULAR | Status: DC | PRN
  Administered 2020-11-28: 4 mg via INTRAVENOUS

## 2020-11-28 MED ORDER — OXYCODONE HCL 5 MG PO TABS
5.0000 mg | ORAL_TABLET | ORAL | Status: DC | PRN
  Administered 2020-11-29 – 2020-12-01 (×2): 10 mg via ORAL
  Filled 2020-11-28 (×3): qty 2

## 2020-11-28 MED ORDER — FENTANYL CITRATE (PF) 100 MCG/2ML IJ SOLN
INTRAMUSCULAR | Status: DC | PRN
  Administered 2020-11-28: 50 ug via INTRAVENOUS

## 2020-11-28 MED ORDER — METHOCARBAMOL 500 MG IVPB - SIMPLE MED
INTRAVENOUS | Status: AC
  Filled 2020-11-28: qty 50

## 2020-11-28 MED ORDER — TRANEXAMIC ACID-NACL 1000-0.7 MG/100ML-% IV SOLN
1000.0000 mg | INTRAVENOUS | Status: AC
  Administered 2020-11-28: 1000 mg via INTRAVENOUS
  Filled 2020-11-28: qty 100

## 2020-11-28 MED ORDER — PROPOFOL 500 MG/50ML IV EMUL
INTRAVENOUS | Status: DC | PRN
  Administered 2020-11-28: 75 ug/kg/min via INTRAVENOUS

## 2020-11-28 MED ORDER — MIDAZOLAM HCL 2 MG/2ML IJ SOLN
INTRAMUSCULAR | Status: AC
  Filled 2020-11-28: qty 2

## 2020-11-28 MED ORDER — CELECOXIB 200 MG PO CAPS
200.0000 mg | ORAL_CAPSULE | Freq: Two times a day (BID) | ORAL | Status: DC
  Administered 2020-11-29 – 2020-12-04 (×10): 200 mg via ORAL
  Filled 2020-11-28 (×10): qty 1

## 2020-11-28 MED ORDER — CHLORHEXIDINE GLUCONATE 0.12 % MT SOLN
15.0000 mL | Freq: Once | OROMUCOSAL | Status: AC
  Administered 2020-11-28: 15 mL via OROMUCOSAL

## 2020-11-28 MED ORDER — TRANEXAMIC ACID-NACL 1000-0.7 MG/100ML-% IV SOLN
1000.0000 mg | Freq: Once | INTRAVENOUS | Status: AC
  Administered 2020-11-29: 1000 mg via INTRAVENOUS
  Filled 2020-11-28: qty 100

## 2020-11-28 MED ORDER — FENTANYL CITRATE (PF) 100 MCG/2ML IJ SOLN
INTRAMUSCULAR | Status: AC
  Filled 2020-11-28: qty 2

## 2020-11-28 MED ORDER — MIDAZOLAM HCL 5 MG/5ML IJ SOLN
INTRAMUSCULAR | Status: DC | PRN
  Administered 2020-11-28: 2 mg via INTRAVENOUS

## 2020-11-28 SURGICAL SUPPLY — 54 items
ADH SKN CLS APL DERMABOND .7 (GAUZE/BANDAGES/DRESSINGS) ×1
BAG COUNTER SPONGE SURGICOUNT (BAG) IMPLANT
BAG DECANTER FOR FLEXI CONT (MISCELLANEOUS) ×2 IMPLANT
BAG SPEC THK2 15X12 ZIP CLS (MISCELLANEOUS) ×1
BAG SPNG CNTER NS LX DISP (BAG)
BAG ZIPLOCK 12X15 (MISCELLANEOUS) ×2 IMPLANT
BLADE SAG 13.0X1.37X90 (BLADE) ×1 IMPLANT
BLADE SAW SGTL 18X1.27X75 (BLADE) IMPLANT
BRUSH FEMORAL CANAL (MISCELLANEOUS) IMPLANT
CEMENT HV SMART SET (Cement) ×2 IMPLANT
COVER SURGICAL LIGHT HANDLE (MISCELLANEOUS) ×2 IMPLANT
DERMABOND ADVANCED (GAUZE/BANDAGES/DRESSINGS) ×1
DERMABOND ADVANCED .7 DNX12 (GAUZE/BANDAGES/DRESSINGS) ×1 IMPLANT
DRAPE INCISE IOBAN 85X60 (DRAPES) ×2 IMPLANT
DRAPE ORTHO SPLIT 77X108 STRL (DRAPES) ×4
DRAPE POUCH INSTRU U-SHP 10X18 (DRAPES) ×2 IMPLANT
DRAPE SURG 17X11 SM STRL (DRAPES) ×2 IMPLANT
DRAPE SURG ORHT 6 SPLT 77X108 (DRAPES) ×2 IMPLANT
DRAPE U-SHAPE 47X51 STRL (DRAPES) ×2 IMPLANT
DRSG AQUACEL AG ADV 3.5X10 (GAUZE/BANDAGES/DRESSINGS) IMPLANT
DRSG AQUACEL AG ADV 3.5X14 (GAUZE/BANDAGES/DRESSINGS) ×1 IMPLANT
DURAPREP 26ML APPLICATOR (WOUND CARE) ×2 IMPLANT
ELECT BLADE TIP CTD 4 INCH (ELECTRODE) ×2 IMPLANT
ELECT REM PT RETURN 15FT ADLT (MISCELLANEOUS) ×2 IMPLANT
FACESHIELD WRAPAROUND (MASK) ×8 IMPLANT
FACESHIELD WRAPAROUND OR TEAM (MASK) ×4 IMPLANT
GLOVE SURG ENC MOIS LTX SZ6 (GLOVE) ×4 IMPLANT
GLOVE SURG UNDER LTX SZ6.5 (GLOVE) ×2 IMPLANT
GLOVE SURG UNDER POLY LF SZ7.5 (GLOVE) ×2 IMPLANT
GOWN STRL REUS W/TWL LRG LVL3 (GOWN DISPOSABLE) ×4 IMPLANT
HANDPIECE INTERPULSE COAX TIP (DISPOSABLE) ×2
IRRIGATION SURGIPHOR STRL (IV SOLUTION) IMPLANT
KIT BASIN OR (CUSTOM PROCEDURE TRAY) ×2 IMPLANT
KIT TURNOVER KIT A (KITS) IMPLANT
LINER NEUTRAL 52X36X52 PLUS 4 (Liner) ×1 IMPLANT
MANIFOLD NEPTUNE II (INSTRUMENTS) ×2 IMPLANT
NDL SAFETY ECLIPSE 18X1.5 (NEEDLE) ×1 IMPLANT
NEEDLE HYPO 18GX1.5 SHARP (NEEDLE) ×2
NS IRRIG 1000ML POUR BTL (IV SOLUTION) ×2 IMPLANT
PACK TOTAL JOINT (CUSTOM PROCEDURE TRAY) ×2 IMPLANT
PROTECTOR NERVE ULNAR (MISCELLANEOUS) ×2 IMPLANT
SET HNDPC FAN SPRY TIP SCT (DISPOSABLE) ×1 IMPLANT
SPONGE T-LAP 18X18 ~~LOC~~+RFID (SPONGE) IMPLANT
STAPLER VISISTAT 35W (STAPLE) IMPLANT
SUCTION FRAZIER HANDLE 12FR (TUBING) ×2
SUCTION TUBE FRAZIER 12FR DISP (TUBING) ×1 IMPLANT
SUT STRATAFIX PDS+ 0 24IN (SUTURE) ×2 IMPLANT
SUT VIC AB 1 CT1 36 (SUTURE) ×2 IMPLANT
SUT VIC AB 2-0 CT1 27 (SUTURE) ×4
SUT VIC AB 2-0 CT1 TAPERPNT 27 (SUTURE) ×2 IMPLANT
TOWEL OR 17X26 10 PK STRL BLUE (TOWEL DISPOSABLE) ×4 IMPLANT
TRAY FOLEY MTR SLVR 16FR STAT (SET/KITS/TRAYS/PACK) ×2 IMPLANT
TUBE SUCTION HIGH CAP CLEAR NV (SUCTIONS) ×2 IMPLANT
WATER STERILE IRR 1000ML POUR (IV SOLUTION) ×4 IMPLANT

## 2020-11-28 NOTE — Interval H&P Note (Signed)
History and Physical Interval Note:  11/28/2020 2:17 PM  Gregory Norton  has presented today for surgery, with the diagnosis of Failed left total hip revision.  The various methods of treatment have been discussed with the patient and family. After consideration of risks, benefits and other options for treatment, the patient has consented to  Procedure(s): TOTAL HIP REVISION (Left) as a surgical intervention.  The patient's history has been reviewed, patient examined, no change in status, stable for surgery.  I have reviewed the patient's chart and labs.  Questions were answered to the patient's satisfaction.     [name]

## 2020-11-28 NOTE — Op Note (Signed)
NAME: Gregory Norton, Gregory Norton MEDICAL RECORD NO: [id number] ACCOUNT NO: [id number] DATE OF BIRTH: [birth year] FACILITY: [facility]ATION: [facility] PHYSICIAN: Pietro Cassis. 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  PROCEDURE:  Revision left total hip arthroplasty with revision of the acetabular liner.  COMPONENTS USED:  DePuy 52 x 36, 10-degree face changing liner with cement.  SURGEON:  Dr. Alvan Dame.  ASSISTANT:  Costella Hatcher, PA-C.  Note, Ms. Lu Duffel was present for the entirety of the case for preoperative positioning, perioperative management of the operative extremity and general facilitation of the case and primary wound closure.  ANESTHESIA:  Spinal.  SPECIMENS:  None.  COMPLICATIONS:  None.   BLOOD LOSS: About 300 mL.  DRAINS:  None.  INDICATIONS OF THE PROCEDURE:  The patient is a very pleasant 70 year old male with longstanding history of this left hip including an index procedure over 25 years ago.  He had had a periprosthetic fracture that was revised.  He has had previous  acetabular revision with significant loss of bone stock.  His most recent involvement with his hip has been related to instability.  He is one year out from previously performed cemented liner.  He was living his normal life and actually doing a lot more  activities and probably what he should not have done related to precautions; however, he was feeling quite good.  He has been on the ground with his grandkids.  He has been in low chairs, but just recently while on vacation, he felt a pop in his hip and  shortening with instability and inability to bear weight.  He was seen and evaluated in the  office at the end of the last week and scheduled for surgery today as an add on.  We had reviewed plans and options.  Given his limited bone stock and the  concern for any potential acetabular revision, failing to provide adequate support, we discussed doing the same type of revision surgery versus alternatives based on availability of components.  Risks of recurrent instability, infection, DVT were all  discussed and reviewed.  We did review the fact that he would need to be abiding the posterior hip precautions postoperatively.  He is aware of this.  Consent was obtained for improvement of his pain and functionality.  DESCRIPTION OF PROCEDURE:  The patient was brought to the operative theater.  Once adequate anesthesia, preoperative antibiotics, Ancef administered, he was positioned in the right lateral decubitus position with the left hip up.  The left lower  extremity was then prepped and draped in sterile fashion.  A timeout was performed identifying the patient, planned procedure, and extremity.  His old incision was used for posterior approach to the hip.  Soft tissue planes were created as best as  possible.  I then opened up the iliotibial band and gluteal fascia exposing the posterior aspect of the hip.  We encountered clear synovial fluid.  No signs of infection.  Following the initial exposure of the posterior aspect of the hip, I did identify  that the polyethylene liner that was cemented in place, had dislodged and was loose.  There were cement fragments inside the joint.  All of this was removed. I then spent a significant  amount of time mobilizing the soft tissues and debriding scar tissue  to allow for mobilization of the femoral component.  I did try to remove his femoral head and again as I was the last time I was unsuccessful in removing the femoral head, which was a modular 36+15 ball.  Given this, this eliminated one of the options I  had planned for and had prepared for.  I thus tried  to subluxate the femur as much as possible.  I removed remaining cement within the acetabular shell.  We then opened up a new 52 x 36, +4, 10-degree face-changing liner and mixed a batch of cement.   This new liner was scuffed up on the back table using an oscillating saw to allow for cement interdigitation.  We then cemented the liner into position.  I tried to have as much anteversion as possible, probably reproducing the shell about 30 degrees,  which was significantly more than what his acetabular shell was in place.  We held this in place until the cement fully cured.  We then reduced the hip.  Any remaining cement that needed to be removed was removed.  I then put him through a range of  motion. In extension, there was no evidence of subluxation, indicating that his leg lengths were appropriate; however, with forward flexion and internal rotation, there was early trochanter abutment from prior surgeries and overgrowth.  I spent time at  this point removing bone off the anterior aspect of the trochanter as well as removing further scar tissue anteriorly and by doing so, I was able to improve the range of motion before subluxation.  Once I had completed all these procedures and felt  satisfied with the procedures performed and the range of motion, we irrigated his hip with pulse lavage normal saline solution for about a liter of fluid.  I then reapproximated the iliotibial band and gluteal fascia using #1 Vicryl and #1 Stratafix  suture.  The remainder of the wound was closed with 2-0 Vicryl and a running Monocryl stitch.  The hip wound was then cleaned, dried and dressed sterilely using surgical glue and Aquacel dressing.  Postoperatively, he will be weightbearing as tolerated; however, we will have physical therapy review with him posterior hip precautions, trying to maintain elevated seat height and to provide and to prevent flexion and internal rotation of the hip.  I  did review these findings and  concerns with his wife.   MUK D: 11/28/2020 6:10:12 pm T: 11/28/2020 9:44:00 pm  JOB: [id number]/ [id number]

## 2020-11-28 NOTE — Anesthesia Procedure Notes (Signed)
Procedure Name: MAC Date/Time: 11/28/2020 3:49 PM Performed by: Lissa Morales, CRNA Pre-anesthesia Checklist: Patient identified, Emergency Drugs available, Suction available and Patient being monitored Patient Re-evaluated:Patient Re-evaluated prior to induction Oxygen Delivery Method: Simple face mask Placement Confirmation: positive ETCO2

## 2020-11-28 NOTE — Transfer of Care (Signed)
Immediate Anesthesia Transfer of Care Note  Patient: Gregory Norton  Procedure(s) Performed: TOTAL HIP REVISION (Left: Hip)  Patient Location: PACU  Anesthesia Type:Spinal  Level of Consciousness: awake and patient cooperative  Airway & Oxygen Therapy: Patient Spontanous Breathing and Patient connected to face mask oxygen  Post-op Assessment:no complaints  Post vital signs: stable  Last Vitals:  Vitals Value Taken Time  BP 122/84 11/28/20 1821  Temp    Pulse 62 11/28/20 1826  Resp 13 11/28/20 1826  SpO2 100 % 11/28/20 1826  Vitals shown include unvalidated device data.  Last Pain:  Vitals:   11/28/20 1400  TempSrc:   PainSc: 1       Patients Stated Pain Goal: 4 (01/23/01 4961)  Complications: No notable events documented.

## 2020-11-28 NOTE — Discharge Instructions (Signed)

## 2020-11-28 NOTE — Anesthesia Preprocedure Evaluation (Addendum)
Anesthesia Evaluation  Patient identified by MRN, date of birth, ID band Patient awake    Reviewed: Allergy & Precautions, NPO status , Patient's Chart, lab work & pertinent test results  Airway Mallampati: II  TM Distance: >3 FB Neck ROM: Full    Dental  (+) Dental Advisory Given, Teeth Intact   Pulmonary sleep apnea , former smoker,    Pulmonary exam normal breath sounds clear to auscultation       Cardiovascular Exercise Tolerance: Good hypertension, Pt. on medications Normal cardiovascular exam Rhythm:Regular Rate:Normal     Neuro/Psych    GI/Hepatic GERD  Medicated and Controlled,  Endo/Other    Renal/GU      Musculoskeletal  (+) Arthritis , Osteoarthritis,    Abdominal (+) + obese,   Peds  Hematology  (+) Blood dyscrasia, anemia ,   Anesthesia Other Findings   Reproductive/Obstetrics                            Anesthesia Physical Anesthesia Plan  ASA: 2  Anesthesia Plan: Spinal   Post-op Pain Management:    Induction: Intravenous  PONV Risk Score and Plan: 2 and Ondansetron, Dexamethasone, Propofol infusion, Treatment may vary due to age or medical condition and TIVA  Airway Management Planned: Natural Airway  Additional Equipment: None  Intra-op Plan:   Post-operative Plan:   Informed Consent: I have reviewed the patients History and Physical, chart, labs and discussed the procedure including the risks, benefits and alternatives for the proposed anesthesia with the patient or authorized representative who has indicated his/her understanding and acceptance.     Dental advisory given  Plan Discussed with: CRNA  Anesthesia Plan Comments:        Anesthesia Quick Evaluation

## 2020-11-28 NOTE — Brief Op Note (Signed)
11/28/2020  3:34 PM  PATIENT:  Gregory Norton  70 y.o. male  PRE-OPERATIVE DIAGNOSIS:  Failed left total hip revision due to instability  POST-OPERATIVE DIAGNOSIS:  Failed left total hip revision due to instability  PROCEDURE:  Procedure(s): TOTAL HIP REVISION (Left)  SURGEON:  Surgeon(s) and Role:    Paralee Cancel, MD - Primary  PHYSICIAN ASSISTANT: Costella Hatcher, PA-C  ANESTHESIA:   spinal  EBL:  300 cc  BLOOD ADMINISTERED:none  DRAINS: none   LOCAL MEDICATIONS USED:  NONE  SPECIMEN:  No Specimen  DISPOSITION OF SPECIMEN:  N/A  COUNTS:  YES  TOURNIQUET:  * No tourniquets in log *  DICTATION: .Other Dictation: Dictation Number [id number]  PLAN OF CARE: Admit for overnight observation  PATIENT DISPOSITION:  PACU - hemodynamically stable.   Delay start of Pharmacological VTE agent (>24hrs) due to surgical blood loss or risk of bleeding: no

## 2020-11-28 NOTE — Anesthesia Procedure Notes (Signed)
Spinal  Patient location during procedure: OR End time: 11/28/2020 4:02 PM Reason for block: surgical anesthesia Staffing Performed: resident/CRNA  Anesthesiologist: Nolon Nations, MD Resident/CRNA: Lissa Morales, CRNA Preanesthetic Checklist Completed: patient identified, IV checked, site marked, risks and benefits discussed, surgical consent, monitors and equipment checked, pre-op evaluation and timeout performed Spinal Block Patient position: sitting Prep: DuraPrep Patient monitoring: heart rate, continuous pulse ox and blood pressure Approach: midline Location: L3-4 Injection technique: single-shot Needle Needle type: Pencan  Needle gauge: 24 G Needle length: 9 cm Assessment Events: CSF return Additional Notes Spinal kit and drugs within date.Pt prepped and spinal performed sterilly. CSF free flow , negative heme  or paresthesia. Pt tolerated procedure well

## 2020-11-29 ENCOUNTER — Inpatient Hospital Stay (HOSPITAL_COMMUNITY): Payer: Medicare Other

## 2020-11-29 ENCOUNTER — Encounter (HOSPITAL_COMMUNITY): Payer: Self-pay | Admitting: Orthopedic Surgery

## 2020-11-29 ENCOUNTER — Inpatient Hospital Stay (HOSPITAL_COMMUNITY): Payer: Medicare Other | Admitting: Certified Registered Nurse Anesthetist

## 2020-11-29 ENCOUNTER — Encounter (HOSPITAL_COMMUNITY)
Admission: RE | Disposition: A | Discharge status: Home / Self Care | Payer: Self-pay | Source: Home / Self Care | Attending: Orthopedic Surgery

## 2020-11-29 HISTORY — PX: HIP CLOSED REDUCTION: SHX983

## 2020-11-29 LAB — CBC
HCT: 42.1 % (ref 39.0–52.0)
Hemoglobin: 14.2 g/dL (ref 13.0–17.0)
MCH: 30.6 pg (ref 26.0–34.0)
MCHC: 33.7 g/dL (ref 30.0–36.0)
MCV: 90.7 fL (ref 80.0–100.0)
Platelets: 252 10*3/uL (ref 150–400)
RBC: 4.64 MIL/uL (ref 4.22–5.81)
RDW: 13.2 % (ref 11.5–15.5)
WBC: 16.5 10*3/uL — ABNORMAL HIGH (ref 4.0–10.5)
nRBC: 0 % (ref 0.0–0.2)

## 2020-11-29 LAB — BASIC METABOLIC PANEL
Anion gap: 10 (ref 5–15)
BUN: 16 mg/dL (ref 8–23)
CO2: 29 mmol/L (ref 22–32)
Calcium: 9.2 mg/dL (ref 8.9–10.3)
Chloride: 98 mmol/L (ref 98–111)
Creatinine, Ser: 0.93 mg/dL (ref 0.61–1.24)
GFR, Estimated: >60 mL/min (ref >60)
Glucose, Bld: 185 mg/dL — ABNORMAL HIGH (ref 70–99)
Potassium: 4.2 mmol/L (ref 3.5–5.1)
Sodium: 137 mmol/L (ref 135–145)

## 2020-11-29 SURGERY — CLOSED MANIPULATION, JOINT, HIP
Anesthesia: General | Site: Hip | Laterality: Left

## 2020-11-29 MED ORDER — PROPOFOL 10 MG/ML IV BOLUS
INTRAVENOUS | Status: AC
  Filled 2020-11-29: qty 40

## 2020-11-29 MED ORDER — FENTANYL CITRATE PF 50 MCG/ML IJ SOSY: 25.0000 ug | PREFILLED_SYRINGE | INTRAMUSCULAR | Status: DC | PRN

## 2020-11-29 MED ORDER — ONDANSETRON HCL 4 MG/2ML IJ SOLN
INTRAMUSCULAR | Status: DC | PRN
  Administered 2020-11-29: 4 mg via INTRAVENOUS

## 2020-11-29 MED ORDER — LACTATED RINGERS IV SOLN: INTRAVENOUS | Status: DC

## 2020-11-29 MED ORDER — ONDANSETRON HCL 4 MG/2ML IJ SOLN: 4.0000 mg | Freq: Once | INTRAMUSCULAR | Status: DC | PRN

## 2020-11-29 MED ORDER — POVIDONE-IODINE 10 % EX SWAB: 2.0000 "application " | Freq: Once | CUTANEOUS | Status: DC

## 2020-11-29 MED ORDER — SUCCINYLCHOLINE CHLORIDE 200 MG/10ML IV SOSY
PREFILLED_SYRINGE | INTRAVENOUS | Status: DC | PRN
  Administered 2020-11-29: 160 mg via INTRAVENOUS

## 2020-11-29 MED ORDER — LIDOCAINE 2% (20 MG/ML) 5 ML SYRINGE
INTRAMUSCULAR | Status: DC | PRN
  Administered 2020-11-29: 100 mg via INTRAVENOUS

## 2020-11-29 MED ORDER — PROPOFOL 10 MG/ML IV BOLUS
INTRAVENOUS | Status: DC | PRN
  Administered 2020-11-29: 200 mg via INTRAVENOUS

## 2020-11-29 MED ORDER — FENTANYL CITRATE (PF) 100 MCG/2ML IJ SOLN
INTRAMUSCULAR | Status: AC
  Filled 2020-11-29: qty 2

## 2020-11-29 MED ORDER — CHLORHEXIDINE GLUCONATE 4 % EX LIQD: 60.0000 mL | Freq: Once | CUTANEOUS | Status: DC

## 2020-11-29 MED ORDER — CHLORHEXIDINE GLUCONATE 0.12 % MT SOLN
15.0000 mL | Freq: Once | OROMUCOSAL | Status: AC
  Administered 2020-11-29: 15 mL via OROMUCOSAL

## 2020-11-29 MED ORDER — CHLORHEXIDINE GLUCONATE CLOTH 2 % EX PADS
6.0000 | MEDICATED_PAD | Freq: Every day | CUTANEOUS | Status: DC
  Administered 2020-11-29 – 2020-12-01 (×3): 6 via TOPICAL

## 2020-11-29 MED ORDER — FENTANYL CITRATE (PF) 100 MCG/2ML IJ SOLN
INTRAMUSCULAR | Status: DC | PRN
  Administered 2020-11-29: 50 ug via INTRAVENOUS

## 2020-11-29 MED ORDER — ACETAMINOPHEN 10 MG/ML IV SOLN: 1000.0000 mg | Freq: Once | INTRAVENOUS | Status: DC | PRN

## 2020-11-29 SURGICAL SUPPLY — 19 items
BAG COUNTER SPONGE SURGICOUNT (BAG) IMPLANT
BAG SPNG CNTER NS LX DISP (BAG)
BLADE SAW SGTL 11.0X1.19X90.0M (BLADE) IMPLANT
BNDG ADH 1X3 SHEER STRL LF (GAUZE/BANDAGES/DRESSINGS) IMPLANT
BNDG ADH THN 3X1 STRL LF (GAUZE/BANDAGES/DRESSINGS)
COVER SURGICAL LIGHT HANDLE (MISCELLANEOUS) ×1 IMPLANT
DURAPREP 26ML APPLICATOR (WOUND CARE) ×1 IMPLANT
GAUZE SPONGE 4X4 12PLY STRL (GAUZE/BANDAGES/DRESSINGS) IMPLANT
GLOVE SURG ENC TEXT LTX SZ7 (GLOVE) IMPLANT
GLOVE SURG ORTHO LTX SZ8 (GLOVE) ×1 IMPLANT
GLOVE SURG UNDER POLY LF SZ7.5 (GLOVE) ×3 IMPLANT
GOWN STRL REUS W/TWL LRG LVL3 (GOWN DISPOSABLE) ×1 IMPLANT
MANIFOLD NEPTUNE II (INSTRUMENTS) ×1 IMPLANT
NDL SAFETY ECLIPSE 18X1.5 (NEEDLE) IMPLANT
NEEDLE HYPO 18GX1.5 SHARP (NEEDLE)
PENCIL SMOKE EVACUATOR (MISCELLANEOUS) IMPLANT
PROTECTOR NERVE ULNAR (MISCELLANEOUS) ×1 IMPLANT
SYR CONTROL 10ML LL (SYRINGE) IMPLANT
TOWEL OR 17X26 10 PK STRL BLUE (TOWEL DISPOSABLE) ×2 IMPLANT

## 2020-11-29 NOTE — Interval H&P Note (Signed)
History and Physical Interval Note:  11/29/2020 1:25 PM  Gregory Norton  has presented today for surgery, with the diagnosis of left hip dislocation.  The various methods of treatment have been discussed with the patient and family. After consideration of risks, benefits and other options for treatment, the patient has consented to  Procedure(s): [place] (Left) as a surgical intervention.  The patient's history has been reviewed, patient examined, no change in status, stable for surgery.  I have reviewed the patient's chart and labs.  Questions were answered to the patient's satisfaction.     [name]

## 2020-11-29 NOTE — Progress Notes (Signed)
PT Cancellation Note  Patient Details Name: Gregory Norton MRN: [id number] DOB: 11-05-1950   Cancelled Treatment:    Reason Eval/Treat Not Completed: Medical issues which prohibited therapy.   Pt with post op hip dislocation. Defer PT eval at this time.  Baxter Flattery, PT  Acute Rehab Dept [facility]) [phone] Pager [phone]  11/29/2020    Kenyon Ana 11/29/2020, 10:05 AM

## 2020-11-29 NOTE — TOC Initial Note (Signed)
Transition of Care [facility]) - Initial/Assessment Note   Patient Details  Name: Gregory Norton MRN: [id number] Date of Birth: [birth year]  Transition of Care [facility]) CM/SW Contact:    Sherie Don, LCSW Phone Number: 11/29/2020, 12:45 PM  Clinical Narrative: CSW met with patient to confirm discharge plan, however, patient's hip was dislocated and he will have another surgery today. Patient confirmed patient has a wheelchair, rolling walker, crutches, cane, and elevated toilets at home so there are no DME needs at this time. TOC awaiting updated PT recommendation following surgery.  Expected Discharge Plan: Home/Self Care Barriers to Discharge: Continued Medical Work up  Patient Goals and CMS Choice Patient states their goals for this hospitalization and ongoing recovery are:: Discharge home after surgery if possible CMS Medicare.gov Compare Post Acute Care list provided to:: Patient  Expected Discharge Plan and Services Expected Discharge Plan: Home/Self Care In-house Referral: Clinical Social Work Living arrangements for the past 2 months: Single Family Home          DME Arranged: N/A DME Agency: NA  Prior Living Arrangements/Services Living arrangements for the past 2 months: Single Family Home Lives with:: Spouse Patient language and need for interpreter reviewed:: Yes Do you feel safe going back to the place where you live?: Yes      Need for Family Participation in Patient Care: No (Comment) Care giver support system in place?: Yes (comment) Current home services: DME (DME: wheelchair, rolling walker, crutches, cane) Criminal Activity/Legal Involvement Pertinent to Current Situation/Hospitalization: No - Comment as needed  Activities of Daily Living Home Assistive Devices/Equipment: Eyeglasses, Cane (specify quad or straight), Walker (specify type), Wheelchair, Crutches, Built-in shower seat ADL Screening (condition at time of admission) Patient's cognitive ability adequate to  safely complete daily activities?: Yes Is the patient deaf or have difficulty hearing?: No Does the patient have difficulty seeing, even when wearing glasses/contacts?: No Does the patient have difficulty concentrating, remembering, or making decisions?: No Patient able to express need for assistance with ADLs?: Yes Does the patient have difficulty dressing or bathing?: No Independently performs ADLs?: Yes (appropriate for developmental age) Does the patient have difficulty walking or climbing stairs?: Yes Weakness of Legs: Left Weakness of Arms/Hands: None  Emotional Assessment Appearance:: Appears stated age Attitude/Demeanor/Rapport: Engaged Affect (typically observed): Accepting Orientation: : Oriented to Self, Oriented to Place, Oriented to  Time, Oriented to Situation Alcohol / Substance Use: Not Applicable Psych Involvement: No (comment)  Admission diagnosis:  S/P revision of total hip [Z96.649] Patient Active Problem List   Diagnosis Date Noted   Failed total hip arthroplasty ([place]) 12/29/2013   Expectd blood loss anemia 07/21/2012   Obese 07/21/2012   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, [address] [place] [address]Phone: [phone]: [phone] Readmission Risk Interventions No flowsheet data found.

## 2020-11-29 NOTE — H&P (View-Only) (Signed)
Patient ID: Gregory Norton, male   DOB: 06-04-1950, 70 y.o.   MRN: [id number] Subjective: 1 Day Post-Op Procedure(s) (LRB): TOTAL HIP REVISION (Left)    Patient reports pain as none, despite findings  Objective:   VITALS:   Vitals:   11/29/20 0518 11/29/20 0700  BP: (!) 141/88   Pulse: 70   Resp: 17   Temp: 98.2 F (36.8 C) (!) 84.2 F (29 C)  SpO2: 97%     Neurovascular intact Incision: dressing C/D/I  LLE shorter than right  LABS Recent Labs    11/29/20 0316  HGB 14.2  HCT 42.1  WBC 16.5*  PLT 252    Recent Labs    11/29/20 0316  NA 137  K 4.2  BUN 16  CREATININE 0.93  GLUCOSE 185*    No results for input(s): LABPT, INR in the last 72 hours.   Assessment/Plan: 1 Day Post-Op Procedure(s) (LRB): TOTAL HIP REVISION (Left)  Post op dislocation of his prosthesis  Plan: I reviewed with him the condition and findings of his post op radiogrpahs that indicated that his hip had dislocated despite care in trying to prevent it.  But nonetheless I need to take him back to the OR under anesthesia to close reduce the hip.  I hope to not have to ope the hip but that is a possibility NPO Consent ordered

## 2020-11-29 NOTE — Op Note (Signed)
NAME: Gregory, Norton MEDICAL RECORD NO: [id number] ACCOUNT NO: [id number] DATE OF BIRTH: Apr 15, 1950 FAC[facility] PHYSICIAN: Pietro Cassis. Alvan Dame, MD  Operative Report   DATE OF PROCEDURE: 11/29/2020   PREOPERATIVE DIAGNOSIS:  Status post revision left total hip arthroplasty with findings of early dislocation postoperatively.  POSTOPERATIVE DIAGNOSIS:  Status post revision left total hip arthroplasty with findings of early dislocation postoperatively.  PROCEDURE:  Closed reduction of left hip under anesthesia.  SURGEON:  Pietro Cassis. Alvan Dame, MD.  ASSISTANT:  Surgical team including Costella Hatcher, PA-C.  ANESTHESIA:  General.  SPECIMEN:  None.  COMPLICATIONS:  None.  FINDINGS:  After the reduction, we did do radiographs that had revealed a concentrically reduced femoral head and acetabulum.  INDICATIONS FOR PROCEDURE:  The patient is a very pleasant 70 year old male.  He was in the operating room yesterday for a complex revision surgery involving his left hip.  We had cemented in a new polyethylene liner.  I had done significant and adequate  debridement.  We had assessed range of motion and found that his hip was relatively stable.  Given all that I have seen inside his hip over the last couple times I have operated on.  He was then awoken from his anesthesia, but he did have a spinal block  anesthesia.  He was taken to the recovery room where postoperative x-rays were obtained.  At the time I was contacted by the recovery room nurses with findings from radiology of a dislocated prosthesis.  I was away from the hospital and pulled up x-rays  remotely, but must have pulled up x-rays that were not on the correct date that showed that his hip was reduced.  Unfortunately, when I was reviewing the situation this morning with him and reviewing radiographs I did see the x-rays were taken to the  recovery room that did indicate dislocation of the prosthesis.  I reviewed this with  him and apologized for not dealing with this last night; however, I felt that I had looked at the correct x-rays.  We discussed need to go back to the operating, so I  could pull on his leg gently under anesthesia to get it reduced, but avoiding the spinal block anesthesia, so we can actually get his gluteal strength to provide inherent mechanical stability.  He agreed to this.  Consent was obtained.  The risk of  recurrent dislocation and instability were noted.  DESCRIPTION OF PROCEDURE:  The patient was brought to the operative theater.  Once adequate anesthesia was established, a timeout was performed identifying the patient, planned procedure, and extremity.  Under anesthesia, I was able to close reduce this  hip and felt that it reduced.  We then upon completion of this, did go ahead and have a plain x-ray obtained and found that the hip was concentrically reduced.  His foot was placed into the bone foam leg holder to keep his knee neutral as he was awoken  from anesthesia.  Findings were reviewed with his wife.  I will keep again overnight to get him up with physical therapy and make sure that were stable prior to discharge.     SUJ D: 11/29/2020 1:55:59 pm T: 11/29/2020 10:06:00 pm  JOB: [id number]/ [id number]

## 2020-11-29 NOTE — Anesthesia Preprocedure Evaluation (Signed)
Anesthesia Evaluation  Patient identified by MRN, date of birth, ID band Patient awake    Reviewed: Allergy & Precautions, Patient's Chart, lab work & pertinent test results  Airway Mallampati: II  TM Distance: >3 FB Neck ROM: Full    Dental  (+) Dental Advisory Given, Teeth Intact   Pulmonary sleep apnea , former smoker,    Pulmonary exam normal breath sounds clear to auscultation       Cardiovascular Exercise Tolerance: Good hypertension, Pt. on medications Normal cardiovascular exam Rhythm:Regular Rate:Normal     Neuro/Psych    GI/Hepatic GERD  Medicated and Controlled,  Endo/Other    Renal/GU Lab Results      Component                Value               Date                      CREATININE               0.93                11/29/2020                BUN                      16                  11/29/2020                NA                       137                 11/29/2020                K                        4.2                 11/29/2020                CL                       98                  11/29/2020                CO2                      29                  11/29/2020                Musculoskeletal  (+) Arthritis , Osteoarthritis,    Abdominal (+) + obese,   Peds  Hematology  (+) Blood dyscrasia, anemia , Lab Results      Component                Value               Date                      WBC  16.5 (H)            11/29/2020                HGB                      14.2                11/29/2020                HCT                      42.1                11/29/2020                MCV                      90.7                11/29/2020                PLT                      252                 11/29/2020              Anesthesia Other Findings   Reproductive/Obstetrics                             Anesthesia Physical Anesthesia Plan  ASA:  3  Anesthesia Plan: General   Post-op Pain Management:    Induction: Intravenous  PONV Risk Score and Plan: Treatment may vary due to age or medical condition, Ondansetron, Midazolam and Dexamethasone  Airway Management Planned: Oral ETT  Additional Equipment: None  Intra-op Plan:   Post-operative Plan: Extubation in OR  Informed Consent: I have reviewed the patients History and Physical, chart, labs and discussed the procedure including the risks, benefits and alternatives for the proposed anesthesia with the patient or authorized representative who has indicated his/her understanding and acceptance.     Dental advisory given  Plan Discussed with: CRNA and Anesthesiologist  Anesthesia Plan Comments:         Anesthesia Quick Evaluation

## 2020-11-29 NOTE — Progress Notes (Signed)
Orthopedic Tech Progress Note Patient Details:  Gregory Norton 12-Feb-1950 [id number]  Patient ID: [name], male   DOB: Oct 24, 1950, 70 y.o.   MRN: [id number] No OHF; pt is over 68.  Vernona Rieger 11/29/2020, 8:58 AM

## 2020-11-29 NOTE — Anesthesia Postprocedure Evaluation (Signed)
Anesthesia Post Note  Patient: Gregory Norton  Procedure(s) Performed: CLOSED MANIPULATION HIP (Left: Hip)     Patient location during evaluation: PACU Anesthesia Type: General Level of consciousness: awake and alert Pain management: pain level controlled Vital Signs Assessment: post-procedure vital signs reviewed and stable Respiratory status: spontaneous breathing, nonlabored ventilation, respiratory function stable and patient connected to nasal cannula oxygen Cardiovascular status: blood pressure returned to baseline and stable Postop Assessment: no apparent nausea or vomiting Anesthetic complications: no   No notable events documented.  Last Vitals:  Vitals:   11/29/20 1415 11/29/20 1430  BP: 134/78 114/76  Pulse: 74 76  Resp: 16 16  Temp:    SpO2: 95% 96%    Last Pain:  Vitals:   11/29/20 1430  TempSrc:   PainSc: 0-No pain                 Barnet Glasgow

## 2020-11-29 NOTE — Transfer of Care (Addendum)
Immediate Anesthesia Transfer of Care Note  Patient: Gregory Norton  Procedure(s) Performed: Procedure(s): CLOSED MANIPULATION HIP (Left)  Patient Location: PACU  Anesthesia Type:General  Level of Consciousness:  sedated, patient cooperative and responds to stimulation  Airway & Oxygen Therapy:Patient Spontanous Breathing and Patient connected to face mask oxgen  Post-op Assessment:  Report given to PACU RN and Post -op Vital signs reviewed and stable  Post vital signs:  Reviewed and stable  Last Vitals:  Vitals:   11/29/20 1206 11/29/20 1354  BP: (!) 157/78 (!) 143/81  Pulse: 74 77  Resp: 16 14  Temp: 36.7 C   SpO2: 70% 26%    Complications: No apparent anesthesia complications

## 2020-11-29 NOTE — Anesthesia Procedure Notes (Signed)
Procedure Name: Intubation Date/Time: 11/29/2020 1:35 PM Performed by: Lavina Hamman, CRNA Pre-anesthesia Checklist: Patient identified, Emergency Drugs available, Suction available, Patient being monitored and Timeout performed Patient Re-evaluated:Patient Re-evaluated prior to induction Oxygen Delivery Method: Circle system utilized Preoxygenation: Pre-oxygenation with 100% oxygen Induction Type: IV induction Ventilation: Mask ventilation without difficulty Laryngoscope Size: Mac and 4 Grade View: Grade I Tube type: Oral Tube size: 7.5 mm Number of attempts: 1 Airway Equipment and Method: Stylet Placement Confirmation: ETT inserted through vocal cords under direct vision, positive ETCO2, CO2 detector and breath sounds checked- equal and bilateral Secured at: 22 cm Tube secured with: Tape Dental Injury: Teeth and Oropharynx as per pre-operative assessment  Comments: ATOI

## 2020-11-29 NOTE — Brief Op Note (Signed)
11/28/2020 - 11/29/2020  1:28 PM  PATIENT:  Gregory Norton  70 y.o. 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   Delay start of Pharmacological VTE agent (>24hrs) due to surgical blood loss or risk of bleeding: no

## 2020-11-29 NOTE — Progress Notes (Signed)
Patient ID: Gregory Norton, male   DOB: 12-20-1950, 70 y.o.   MRN: [id number] Subjective: 1 Day Post-Op Procedure(s) (LRB): TOTAL HIP REVISION (Left)    Patient reports pain as none, despite findings  Objective:   VITALS:   Vitals:   11/29/20 0518 11/29/20 0700  BP: (!) 141/88   Pulse: 70   Resp: 17   Temp: 98.2 F (36.8 C) (!) 84.2 F (29 C)  SpO2: 97%     Neurovascular intact Incision: dressing C/D/I  LLE shorter than right  LABS Recent Labs    11/29/20 0316  HGB 14.2  HCT 42.1  WBC 16.5*  PLT 252    Recent Labs    11/29/20 0316  NA 137  K 4.2  BUN 16  CREATININE 0.93  GLUCOSE 185*    No results for input(s): LABPT, INR in the last 72 hours.   Assessment/Plan: 1 Day Post-Op Procedure(s) (LRB): TOTAL HIP REVISION (Left)  Post op dislocation of his prosthesis  Plan: I reviewed with him the condition and findings of his post op radiogrpahs that indicated that his hip had dislocated despite care in trying to prevent it.  But nonetheless I need to take him back to the OR under anesthesia to close reduce the hip.  I hope to not have to ope the hip but that is a possibility NPO Consent ordered

## 2020-11-30 ENCOUNTER — Encounter (HOSPITAL_COMMUNITY): Payer: Self-pay | Admitting: Orthopedic Surgery

## 2020-11-30 ENCOUNTER — Inpatient Hospital Stay (HOSPITAL_COMMUNITY): Payer: Medicare Other

## 2020-11-30 ENCOUNTER — Other Ambulatory Visit: Payer: Self-pay

## 2020-11-30 LAB — CBC
HCT: 41.5 % (ref 39.0–52.0)
Hemoglobin: 13.6 g/dL (ref 13.0–17.0)
MCH: 30.1 pg (ref 26.0–34.0)
MCHC: 32.8 g/dL (ref 30.0–36.0)
MCV: 91.8 fL (ref 80.0–100.0)
Platelets: 235 10*3/uL (ref 150–400)
RBC: 4.52 MIL/uL (ref 4.22–5.81)
RDW: 13.2 % (ref 11.5–15.5)
WBC: 14.6 10*3/uL — ABNORMAL HIGH (ref 4.0–10.5)
nRBC: 0 % (ref 0.0–0.2)

## 2020-11-30 NOTE — Anesthesia Postprocedure Evaluation (Signed)
Anesthesia Post Note  Patient: Gregory Norton  Procedure(s) Performed: TOTAL HIP REVISION (Left: Hip)     Patient location during evaluation: PACU Anesthesia Type: General Level of consciousness: sedated and patient cooperative Pain management: pain level controlled Vital Signs Assessment: post-procedure vital signs reviewed and stable Respiratory status: spontaneous breathing Cardiovascular status: stable Anesthetic complications: no   No notable events documented.  Last Vitals:  Vitals:   11/30/20 0452 11/30/20 1358  BP: 139/72 130/79  Pulse: 64 71  Resp: 17 18  Temp: 36.8 C 36.9 C  SpO2: 98% 96%    Last Pain:  Vitals:   11/30/20 1358  TempSrc: Oral  PainSc:                  Nolon Nations

## 2020-11-30 NOTE — Progress Notes (Signed)
   Subjective: 1 Day Post-Op Procedure(s) (LRB): CLOSED MANIPULATION HIP (Left) Patient reports pain as mild.   Patient seen in rounds for Dr. Alvan Dame. Patient is resting in bed on exam this morning. He reports yesterday was okay. He did not get up with PT. They kept his foot in the bone foam yesterday. Voiding without difficulty.  We will start therapy today.   Objective: Vital signs in last 24 hours: Temp:  [97.9 F (36.6 C)-98.5 F (36.9 C)] 98.2 F (36.8 C) (11/17 0452) Pulse Rate:  [64-81] 64 (11/17 0452) Resp:  [14-18] 17 (11/17 0452) BP: (114-157)/(71-81) 139/72 (11/17 0452) SpO2:  [93 %-99 %] 98 % (11/17 0452) Weight:  [124.7 kg] 124.7 kg (11/16 1222)  Intake/Output from previous day:  Intake/Output Summary (Last 24 hours) at 11/30/2020 0739 Last data filed at 11/30/2020 0723 Gross per 24 hour  Intake 2094.3 ml  Output 3280 ml  Net -1185.7 ml     Intake/Output this shift: Total I/O In: -  Out: 580 [Urine:580]  Labs: Recent Labs    11/29/20 0316 11/30/20 0308  HGB 14.2 13.6   Recent Labs    11/29/20 0316 11/30/20 0308  WBC 16.5* 14.6*  RBC 4.64 4.52  HCT 42.1 41.5  PLT 252 235   Recent Labs    11/29/20 0316  NA 137  K 4.2  CL 98  CO2 29  BUN 16  CREATININE 0.93  GLUCOSE 185*  CALCIUM 9.2   No results for input(s): LABPT, INR in the last 72 hours.  Exam: General - Patient is Alert and Oriented Extremity - Neurologically intact Sensation intact distally Intact pulses distally Dorsiflexion/Plantar flexion intact Dressing - dressing C/D/I Motor Function - intact, moving foot and toes well on exam.   Past Medical History:  Diagnosis Date   Arthritis    Cancer ([place]) 2014   prostate cancer   Complication of anesthesia    low O2 sats, could not wake up after hip surgery   GERD (gastroesophageal reflux disease)    Hypercholesteremia    Hypertension    Osteoporosis    Pre-diabetes    Sleep apnea    no CPAP Pt lost wt     Assessment/Plan: 1 Day Post-Op Procedure(s) (LRB): CLOSED MANIPULATION HIP (Left) Principal Problem:   S/P left TH revision  Estimated body mass index is 36.27 kg/m as calculated from the following:   Height as of this encounter: 6\' 1"  (1.854 m).   Weight as of this encounter: 124.7 kg. Advance diet Up with therapy  DVT Prophylaxis - Aspirin Weight bearing as tolerated Hip precautions discussed with patient  I discussed with the patient hip precautions, which are the same that he has had previously. It is likely that he dislocated early on after surgery, before his spinal wore off. He does not need to remain in the bone foam at all times. He can use this at night early on to help keep his hip in good alignment.   Plan to get up with PT today to ambulate. If he is meeting his goals, we will plan on discharge later this afternoon.    Griffith Citron, PA-C Orthopedic Surgery [phone] 11/30/2020, 7:39 AM

## 2020-11-30 NOTE — Evaluation (Signed)
Physical Therapy Evaluation Patient Details Name: Gregory Norton MRN: [id number] DOB: Mar 06, 1950 Today's Date: 11/30/2020  History of Present Illness  Pt s/p L THR revision 11/28/20 with post op dislocation and manipulation 11/29/20.  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  Clinical Impression  Pt s/p L THR revision and presents with decreased L LE strength/ROM, post op pain and posterior THP limiting functional mobility.  Pt should progress to dc home with family assist.  This am pt performed therex program with assist and assist OOB to standing.  In standing, pt states "my leg feels like it just sunk in, I don't know if its still in place".  Pt assisted back to bed and positioned for comfort.  L LE appears to be in good position and pt able to move appropriately with assist but RN notified and will advise PA.     Recommendations for follow up therapy are one component of a multi-disciplinary discharge planning process, led by the attending physician.  Recommendations may be updated based on patient status, additional functional criteria and insurance authorization.  Follow Up Recommendations Follow physician's recommendations for discharge plan and follow up therapies    Assistance Recommended at Discharge Intermittent Supervision/Assistance  Functional Status Assessment Patient has had a recent decline in their functional status and demonstrates the ability to make significant improvements in function in a reasonable and predictable amount of time.  Equipment Recommendations  None recommended by PT    Recommendations for Other Services       Precautions / Restrictions Precautions Precautions: Posterior Hip;Fall Restrictions Weight Bearing Restrictions: No LLE Weight Bearing: Weight bearing as tolerated      Mobility  Bed Mobility Overal bed mobility: Needs Assistance Bed Mobility: Supine to Sit     Supine to sit: Min assist     General bed mobility  comments: cues for sequence and use of R LE to self assist.  Physical assist to manage L LE    Transfers Overall transfer level: Needs assistance Equipment used: Rolling walker (2 wheels) Transfers: Sit to/from Stand Sit to Stand: Min assist           General transfer comment: cues for LE management and use of UEs to self assist    Ambulation/Gait Ambulation/Gait assistance: Min assist Gait Distance (Feet): 2 Feet Assistive device: Rolling walker (2 wheels) Gait Pattern/deviations: Step-to pattern;Decreased step length - right;Decreased step length - left;Shuffle;Trunk flexed Gait velocity: decr     General Gait Details: Pt side-stepped up side of bed only  Stairs            Wheelchair Mobility    Modified Rankin (Stroke Patients Only)       Balance Overall balance assessment: Needs assistance Sitting-balance support: No upper extremity supported;Feet supported Sitting balance-Leahy Scale: Good     Standing balance support: Bilateral upper extremity supported Standing balance-Leahy Scale: Poor                               Pertinent Vitals/Pain Pain Assessment: 0-10 Pain Score: 4  Pain Location: L hip Pain Descriptors / Indicators: Aching;Sore Pain Intervention(s): Limited activity within patient's tolerance;Monitored during session;Premedicated before session;Ice applied    Home Living Family/patient expects to be discharged to:: Private residence Living Arrangements: Spouse/significant other Available Help at Discharge: Family Type of Home: House Home Access: Level entry       Home Layout: One level Home  Equipment: Conservation officer, nature (2 wheels);Cane - single point;BSC/3in1;Shower seat;Grab bars - toilet;Grab bars - tub/shower      Prior Function Prior Level of Function : Independent/Modified Independent                     Hand Dominance   Dominant Hand: Right    Extremity/Trunk Assessment   Upper Extremity  Assessment Upper Extremity Assessment: Overall WFL for tasks assessed    Lower Extremity Assessment Lower Extremity Assessment: LLE deficits/detail LLE Deficits / Details: AAROM L hip to 75 flex and 15 abd    Cervical / Trunk Assessment Cervical / Trunk Assessment: Normal  Communication   Communication: No difficulties  Cognition Arousal/Alertness: Awake/alert Behavior During Therapy: WFL for tasks assessed/performed Overall Cognitive Status: Within Functional Limits for tasks assessed                                          General Comments      Exercises Total Joint Exercises Ankle Circles/Pumps: AROM;Both;15 reps;Supine Quad Sets: AROM;Both;10 reps;Supine Heel Slides: AAROM;Left;15 reps;Supine Hip ABduction/ADduction: AAROM;Left;10 reps;Supine   Assessment/Plan    PT Assessment Patient needs continued PT services  PT Problem List Decreased strength;Decreased range of motion;Decreased activity tolerance;Decreased balance;Decreased mobility;Decreased knowledge of use of DME;Pain;Obesity       PT Treatment Interventions DME instruction;Gait training;Stair training;Functional mobility training;Therapeutic activities;Therapeutic exercise;Patient/family education    PT Goals (Current goals can be found in the Care Plan section)  Acute Rehab PT Goals Patient Stated Goal: Regain IND PT Goal Formulation: With patient Time For Goal Achievement: 12/07/20 Potential to Achieve Goals: Good    Frequency 7X/week   Barriers to discharge        Co-evaluation               AM-PAC PT "6 Clicks" Mobility  Outcome Measure Help needed turning from your back to your side while in a flat bed without using bedrails?: A Little Help needed moving from lying on your back to sitting on the side of a flat bed without using bedrails?: A Little Help needed moving to and from a bed to a chair (including a wheelchair)?: A Little Help needed standing up from a chair  using your arms (e.g., wheelchair or bedside chair)?: A Little Help needed to walk in hospital room?: Total Help needed climbing 3-5 steps with a railing? : Total 6 Click Score: 14    End of Session Equipment Utilized During Treatment: Gait belt Activity Tolerance: Other (comment) Patient left: in bed;with call bell/phone within reach;with bed alarm set Nurse Communication: Mobility status PT Visit Diagnosis: Difficulty in walking, not elsewhere classified (R26.2)    Time: 9379-0240 PT Time Calculation (min) (ACUTE ONLY): 32 min   Charges:   PT Evaluation $PT Eval Low Complexity: 1 Low PT Treatments $Therapeutic Exercise: 8-22 mins        [place] Pager [phone] Office [phone]   Dianely Krehbiel 11/30/2020, 1:39 PM

## 2020-11-30 NOTE — Plan of Care (Signed)
  Problem: Pain Management: Goal: Pain level will decrease with appropriate interventions Outcome: Not Progressing   Problem: Clinical Measurements: Goal: Postoperative complications will be avoided or minimized Outcome: Not Progressing   Problem: Activity: Goal: Ability to avoid complications of mobility impairment will improve Outcome: Not Progressing

## 2020-12-01 LAB — MRSA NEXT GEN BY PCR, NASAL: MRSA by PCR Next Gen: NOT DETECTED

## 2020-12-01 NOTE — Progress Notes (Signed)
PT Cancellation Note  Patient Details Name: ERWIN NISHIYAMA MRN: [id number] DOB: 06/09/1950   Cancelled Treatment:     PT deferred this date.  Pt with dislocated L hip - surgery likely tomorrow.  Will follow   Zyren Sevigny 12/01/2020, 10:36 AM

## 2020-12-01 NOTE — Plan of Care (Signed)
  Problem: Clinical Measurements: Goal: Ability to maintain clinical measurements within normal limits will improve Outcome: Progressing   Problem: Activity: Goal: Risk for activity intolerance will decrease Outcome: Progressing   Problem: Pain Managment: Goal: General experience of comfort will improve Outcome: Progressing   

## 2020-12-01 NOTE — Plan of Care (Signed)
  Problem: Activity: Goal: Ability to avoid complications of mobility impairment will improve Outcome: Not Progressing   Problem: Safety: Goal: Ability to remain free from injury will improve Outcome: Not Progressing   Problem: Skin Integrity: Goal: Will show signs of wound healing Outcome: Not Progressing

## 2020-12-01 NOTE — Progress Notes (Signed)
Patient ID: Gregory Norton, male   DOB: 1950-05-21, 70 y.o.   MRN: [id number] Subjective: 2 Days Post-Op Procedure(s) (LRB): CLOSED MANIPULATION HIP (Left)    Patient reports pain as mild to moderate given current dislocation  Objective:   VITALS:   Vitals:   11/30/20 2209 12/01/20 0530  BP: 128/73 125/82  Pulse: 65 61  Resp: 14 14  Temp: 98.2 F (36.8 C) 97.9 F (36.6 C)  SpO2: 94% 95%    Neurovascular intact Incision: dressing C/D/I  LLE shortened  LABS Recent Labs    11/29/20 0316 11/30/20 0308  HGB 14.2 13.6  HCT 42.1 41.5  WBC 16.5* 14.6*  PLT 252 235    Recent Labs    11/29/20 0316  NA 137  K 4.2  BUN 16  CREATININE 0.93  GLUCOSE 185*    No results for input(s): LABPT, INR in the last 72 hours.   Assessment/Plan: 2 Days Post-Op Procedure(s) (LRB): CLOSED MANIPULATION HIP (Left) Recurrent dislocation left hip  Plan: I reviewed with him the current condition of his hip I have to take him back to the operating room to reassess and revise his hip to prevent this from dislocating I have reviewed plans and thoughts Risks known NPO after midnight

## 2020-12-01 NOTE — Plan of Care (Signed)
  Problem: Nutrition: Goal: Adequate nutrition will be maintained Outcome: Progressing   Problem: Coping: Goal: Level of anxiety will decrease Outcome: Progressing   Problem: Elimination: Goal: Will not experience complications related to urinary retention Outcome: Progressing   Problem: Pain Managment: Goal: General experience of comfort will improve Outcome: Progressing   

## 2020-12-02 ENCOUNTER — Inpatient Hospital Stay (HOSPITAL_COMMUNITY): Payer: Medicare Other | Admitting: Registered Nurse

## 2020-12-02 ENCOUNTER — Inpatient Hospital Stay (HOSPITAL_COMMUNITY): Payer: Medicare Other

## 2020-12-02 ENCOUNTER — Encounter (HOSPITAL_COMMUNITY)
Admission: RE | Disposition: A | Discharge status: Home / Self Care | Payer: Self-pay | Source: Home / Self Care | Attending: Orthopedic Surgery

## 2020-12-02 HISTORY — PX: TOTAL HIP ARTHROPLASTY: SHX124

## 2020-12-02 SURGERY — ARTHROPLASTY, HIP, TOTAL,POSTERIOR APPROACH
Anesthesia: Spinal | Site: Hip | Laterality: Left

## 2020-12-02 MED ORDER — MENTHOL 3 MG MT LOZG: 1.0000 | LOZENGE | OROMUCOSAL | Status: DC | PRN

## 2020-12-02 MED ORDER — PHENYLEPHRINE HCL (PRESSORS) 10 MG/ML IV SOLN
INTRAVENOUS | Status: AC
  Filled 2020-12-02: qty 2

## 2020-12-02 MED ORDER — HYDROMORPHONE HCL 1 MG/ML IJ SOLN: 0.2500 mg | INTRAMUSCULAR | Status: DC | PRN

## 2020-12-02 MED ORDER — ONDANSETRON HCL 4 MG/2ML IJ SOLN: 4.0000 mg | Freq: Four times a day (QID) | INTRAMUSCULAR | Status: DC | PRN

## 2020-12-02 MED ORDER — CEFAZOLIN IN SODIUM CHLORIDE 3-0.9 GM/100ML-% IV SOLN: 3.0000 g | INTRAVENOUS | Status: DC

## 2020-12-02 MED ORDER — HYDROMORPHONE HCL 1 MG/ML IJ SOLN
INTRAMUSCULAR | Status: AC
  Filled 2020-12-02: qty 2

## 2020-12-02 MED ORDER — PROPOFOL 10 MG/ML IV BOLUS
INTRAVENOUS | Status: DC | PRN
  Administered 2020-12-02: 150 mg via INTRAVENOUS

## 2020-12-02 MED ORDER — PROPOFOL 10 MG/ML IV BOLUS
INTRAVENOUS | Status: AC
  Filled 2020-12-02: qty 20

## 2020-12-02 MED ORDER — FERROUS SULFATE 325 (65 FE) MG PO TABS
325.0000 mg | ORAL_TABLET | Freq: Three times a day (TID) | ORAL | Status: DC
  Administered 2020-12-02 – 2020-12-04 (×5): 325 mg via ORAL
  Filled 2020-12-02 (×5): qty 1

## 2020-12-02 MED ORDER — METOCLOPRAMIDE HCL 5 MG PO TABS: 5.0000 mg | ORAL_TABLET | Freq: Three times a day (TID) | ORAL | Status: DC | PRN

## 2020-12-02 MED ORDER — TRANEXAMIC ACID-NACL 1000-0.7 MG/100ML-% IV SOLN
1000.0000 mg | Freq: Once | INTRAVENOUS | Status: AC
  Administered 2020-12-02: 1000 mg via INTRAVENOUS
  Filled 2020-12-02: qty 100

## 2020-12-02 MED ORDER — METHOCARBAMOL 500 MG IVPB - SIMPLE MED
500.0000 mg | Freq: Four times a day (QID) | INTRAVENOUS | Status: DC | PRN
  Filled 2020-12-02: qty 50

## 2020-12-02 MED ORDER — CEFAZOLIN IN SODIUM CHLORIDE 3-0.9 GM/100ML-% IV SOLN
INTRAVENOUS | Status: AC
  Filled 2020-12-02: qty 100

## 2020-12-02 MED ORDER — OXYCODONE HCL 5 MG PO TABS: 5.0000 mg | ORAL_TABLET | Freq: Once | ORAL | Status: DC | PRN

## 2020-12-02 MED ORDER — FENTANYL CITRATE (PF) 250 MCG/5ML IJ SOLN
INTRAMUSCULAR | Status: AC
  Filled 2020-12-02: qty 5

## 2020-12-02 MED ORDER — PROMETHAZINE HCL 25 MG/ML IJ SOLN: 6.2500 mg | INTRAMUSCULAR | Status: DC | PRN

## 2020-12-02 MED ORDER — METHOCARBAMOL 500 MG PO TABS
500.0000 mg | ORAL_TABLET | Freq: Four times a day (QID) | ORAL | Status: DC | PRN
  Administered 2020-12-02 – 2020-12-03 (×2): 500 mg via ORAL
  Filled 2020-12-02 (×2): qty 1

## 2020-12-02 MED ORDER — SODIUM CHLORIDE 0.9 % IR SOLN
Status: DC | PRN
  Administered 2020-12-02: 3000 mL

## 2020-12-02 MED ORDER — OXYCODONE HCL 5 MG/5ML PO SOLN: 5.0000 mg | Freq: Once | ORAL | Status: DC | PRN

## 2020-12-02 MED ORDER — ONDANSETRON HCL 4 MG/2ML IJ SOLN
INTRAMUSCULAR | Status: AC
  Filled 2020-12-02: qty 2

## 2020-12-02 MED ORDER — STERILE WATER FOR IRRIGATION IR SOLN
Status: DC | PRN
  Administered 2020-12-02: 2000 mL

## 2020-12-02 MED ORDER — BISACODYL 10 MG RE SUPP: 10.0000 mg | Freq: Every day | RECTAL | Status: DC | PRN

## 2020-12-02 MED ORDER — DEXAMETHASONE SODIUM PHOSPHATE 10 MG/ML IJ SOLN
10.0000 mg | Freq: Once | INTRAMUSCULAR | Status: AC
  Administered 2020-12-03: 10 mg via INTRAVENOUS
  Filled 2020-12-02: qty 1

## 2020-12-02 MED ORDER — OXYCODONE HCL 5 MG PO TABS: 10.0000 mg | ORAL_TABLET | ORAL | Status: DC | PRN

## 2020-12-02 MED ORDER — SODIUM CHLORIDE 0.9 % IV SOLN: INTRAVENOUS | Status: DC

## 2020-12-02 MED ORDER — CEFAZOLIN SODIUM-DEXTROSE 2-4 GM/100ML-% IV SOLN
2.0000 g | Freq: Four times a day (QID) | INTRAVENOUS | Status: AC
  Administered 2020-12-02 (×2): 2 g via INTRAVENOUS
  Filled 2020-12-02 (×2): qty 100

## 2020-12-02 MED ORDER — LACTATED RINGERS IV SOLN: INTRAVENOUS | Status: DC | PRN

## 2020-12-02 MED ORDER — DOCUSATE SODIUM 100 MG PO CAPS
100.0000 mg | ORAL_CAPSULE | Freq: Two times a day (BID) | ORAL | Status: DC
  Administered 2020-12-02 – 2020-12-04 (×5): 100 mg via ORAL
  Filled 2020-12-02 (×5): qty 1

## 2020-12-02 MED ORDER — POLYETHYLENE GLYCOL 3350 17 G PO PACK
17.0000 g | PACK | Freq: Every day | ORAL | Status: DC | PRN
  Administered 2020-12-04: 17 g via ORAL
  Filled 2020-12-02: qty 1

## 2020-12-02 MED ORDER — MIDAZOLAM HCL 5 MG/5ML IJ SOLN
INTRAMUSCULAR | Status: DC | PRN
  Administered 2020-12-02: 2 mg via INTRAVENOUS

## 2020-12-02 MED ORDER — PHENOL 1.4 % MT LIQD: 1.0000 | OROMUCOSAL | Status: DC | PRN

## 2020-12-02 MED ORDER — CHLORHEXIDINE GLUCONATE 4 % EX LIQD: 60.0000 mL | Freq: Once | CUTANEOUS | Status: DC

## 2020-12-02 MED ORDER — ONDANSETRON HCL 4 MG PO TABS: 4.0000 mg | ORAL_TABLET | Freq: Four times a day (QID) | ORAL | Status: DC | PRN

## 2020-12-02 MED ORDER — FENTANYL CITRATE (PF) 100 MCG/2ML IJ SOLN
INTRAMUSCULAR | Status: AC
  Filled 2020-12-02: qty 2

## 2020-12-02 MED ORDER — OXYCODONE HCL 5 MG PO TABS
5.0000 mg | ORAL_TABLET | ORAL | Status: DC | PRN
  Administered 2020-12-02 – 2020-12-03 (×3): 5 mg via ORAL
  Filled 2020-12-02: qty 2
  Filled 2020-12-02 (×4): qty 1

## 2020-12-02 MED ORDER — HYDROMORPHONE HCL 1 MG/ML IJ SOLN: 0.5000 mg | INTRAMUSCULAR | Status: DC | PRN

## 2020-12-02 MED ORDER — 0.9 % SODIUM CHLORIDE (POUR BTL) OPTIME
TOPICAL | Status: DC | PRN
  Administered 2020-12-02: 1000 mL

## 2020-12-02 MED ORDER — DIPHENHYDRAMINE HCL 12.5 MG/5ML PO ELIX: 12.5000 mg | ORAL_SOLUTION | ORAL | Status: DC | PRN

## 2020-12-02 MED ORDER — METOCLOPRAMIDE HCL 5 MG/ML IJ SOLN: 5.0000 mg | Freq: Three times a day (TID) | INTRAMUSCULAR | Status: DC | PRN

## 2020-12-02 MED ORDER — SUGAMMADEX SODIUM 500 MG/5ML IV SOLN
INTRAVENOUS | Status: AC
  Filled 2020-12-02: qty 5

## 2020-12-02 MED ORDER — DEXAMETHASONE SODIUM PHOSPHATE 10 MG/ML IJ SOLN
INTRAMUSCULAR | Status: AC
  Filled 2020-12-02: qty 1

## 2020-12-02 MED ORDER — ASPIRIN 81 MG PO CHEW
81.0000 mg | CHEWABLE_TABLET | Freq: Two times a day (BID) | ORAL | Status: DC
  Administered 2020-12-02 – 2020-12-04 (×4): 81 mg via ORAL
  Filled 2020-12-02 (×4): qty 1

## 2020-12-02 MED ORDER — TRANEXAMIC ACID-NACL 1000-0.7 MG/100ML-% IV SOLN
INTRAVENOUS | Status: AC
  Filled 2020-12-02: qty 100

## 2020-12-02 MED ORDER — POVIDONE-IODINE 10 % EX SWAB: 2.0000 "application " | Freq: Once | CUTANEOUS | Status: DC

## 2020-12-02 MED ORDER — MIDAZOLAM HCL 2 MG/2ML IJ SOLN
INTRAMUSCULAR | Status: AC
  Filled 2020-12-02: qty 2

## 2020-12-02 MED ORDER — TRANEXAMIC ACID-NACL 1000-0.7 MG/100ML-% IV SOLN
1000.0000 mg | INTRAVENOUS | Status: AC
  Administered 2020-12-02: 1000 mg via INTRAVENOUS

## 2020-12-02 MED ORDER — ACETAMINOPHEN 325 MG PO TABS: 325.0000 mg | ORAL_TABLET | Freq: Four times a day (QID) | ORAL | Status: DC | PRN

## 2020-12-02 MED ORDER — LIDOCAINE HCL (CARDIAC) PF 100 MG/5ML IV SOSY
PREFILLED_SYRINGE | INTRAVENOUS | Status: DC | PRN
  Administered 2020-12-02: 100 mg via INTRAVENOUS

## 2020-12-02 MED ORDER — ROCURONIUM BROMIDE 100 MG/10ML IV SOLN
INTRAVENOUS | Status: DC | PRN
  Administered 2020-12-02: 100 mg via INTRAVENOUS

## 2020-12-02 MED ORDER — FENTANYL CITRATE (PF) 100 MCG/2ML IJ SOLN
INTRAMUSCULAR | Status: DC | PRN
  Administered 2020-12-02 (×2): 50 ug via INTRAVENOUS
  Administered 2020-12-02: 100 ug via INTRAVENOUS

## 2020-12-02 MED ORDER — PHENYLEPHRINE HCL-NACL 20-0.9 MG/250ML-% IV SOLN
INTRAVENOUS | Status: DC | PRN
  Administered 2020-12-02: 25 ug/min via INTRAVENOUS

## 2020-12-02 SURGICAL SUPPLY — 53 items
ADH SKN CLS APL DERMABOND .7 (GAUZE/BANDAGES/DRESSINGS) ×1
BAG COUNTER SPONGE SURGICOUNT (BAG) IMPLANT
BAG DECANTER FOR FLEXI CONT (MISCELLANEOUS) ×2 IMPLANT
BAG SPEC THK2 15X12 ZIP CLS (MISCELLANEOUS) ×1
BAG SPNG CNTER NS LX DISP (BAG)
BAG ZIPLOCK 12X15 (MISCELLANEOUS) ×2 IMPLANT
BLADE SAW SGTL 18X1.27X75 (BLADE) IMPLANT
BRUSH FEMORAL CANAL (MISCELLANEOUS) IMPLANT
CEMENT BONE DEPUY (Cement) ×2 IMPLANT
COVER SURGICAL LIGHT HANDLE (MISCELLANEOUS) ×2 IMPLANT
DERMABOND ADVANCED (GAUZE/BANDAGES/DRESSINGS) ×1
DERMABOND ADVANCED .7 DNX12 (GAUZE/BANDAGES/DRESSINGS) ×1 IMPLANT
DRAPE INCISE IOBAN 85X60 (DRAPES) ×2 IMPLANT
DRAPE ORTHO SPLIT 77X108 STRL (DRAPES) ×4
DRAPE POUCH INSTRU U-SHP 10X18 (DRAPES) ×2 IMPLANT
DRAPE SURG 17X11 SM STRL (DRAPES) ×2 IMPLANT
DRAPE SURG ORHT 6 SPLT 77X108 (DRAPES) ×2 IMPLANT
DRAPE U-SHAPE 47X51 STRL (DRAPES) ×2 IMPLANT
DRSG AQUACEL AG ADV 3.5X10 (GAUZE/BANDAGES/DRESSINGS) IMPLANT
DRSG AQUACEL AG ADV 3.5X14 (GAUZE/BANDAGES/DRESSINGS) ×1 IMPLANT
DURAPREP 26ML APPLICATOR (WOUND CARE) ×2 IMPLANT
ELECT BLADE TIP CTD 4 INCH (ELECTRODE) ×2 IMPLANT
ELECT REM PT RETURN 15FT ADLT (MISCELLANEOUS) ×2 IMPLANT
FACESHIELD WRAPAROUND (MASK) ×8 IMPLANT
FACESHIELD WRAPAROUND OR TEAM (MASK) ×4 IMPLANT
GLOVE SURG ENC MOIS LTX SZ6 (GLOVE) ×4 IMPLANT
GLOVE SURG UNDER LTX SZ6.5 (GLOVE) ×2 IMPLANT
GLOVE SURG UNDER POLY LF SZ7.5 (GLOVE) ×2 IMPLANT
GOWN STRL REUS W/TWL LRG LVL3 (GOWN DISPOSABLE) ×4 IMPLANT
HANDPIECE INTERPULSE COAX TIP (DISPOSABLE) ×2
IRRIGATION SURGIPHOR STRL (IV SOLUTION) IMPLANT
KIT BASIN OR (CUSTOM PROCEDURE TRAY) ×2 IMPLANT
KIT TURNOVER KIT A (KITS) IMPLANT
LINER NEUTRAL 52X36X52 PLUS 4 (Liner) ×1 IMPLANT
MANIFOLD NEPTUNE II (INSTRUMENTS) ×2 IMPLANT
NDL SAFETY ECLIPSE 18X1.5 (NEEDLE) ×1 IMPLANT
NEEDLE HYPO 18GX1.5 SHARP (NEEDLE) ×2
NS IRRIG 1000ML POUR BTL (IV SOLUTION) ×2 IMPLANT
PACK TOTAL JOINT (CUSTOM PROCEDURE TRAY) ×2 IMPLANT
PROTECTOR NERVE ULNAR (MISCELLANEOUS) ×2 IMPLANT
SET HNDPC FAN SPRY TIP SCT (DISPOSABLE) ×1 IMPLANT
SPONGE T-LAP 18X18 ~~LOC~~+RFID (SPONGE) IMPLANT
STAPLER VISISTAT 35W (STAPLE) IMPLANT
SUCTION FRAZIER HANDLE 12FR (TUBING) ×2
SUCTION TUBE FRAZIER 12FR DISP (TUBING) ×1 IMPLANT
SUT STRATAFIX PDS+ 0 24IN (SUTURE) ×2 IMPLANT
SUT VIC AB 1 CT1 36 (SUTURE) ×2 IMPLANT
SUT VIC AB 2-0 CT1 27 (SUTURE) ×4
SUT VIC AB 2-0 CT1 TAPERPNT 27 (SUTURE) ×2 IMPLANT
TOWEL OR 17X26 10 PK STRL BLUE (TOWEL DISPOSABLE) ×4 IMPLANT
TRAY FOLEY MTR SLVR 16FR STAT (SET/KITS/TRAYS/PACK) ×2 IMPLANT
TUBE SUCTION HIGH CAP CLEAR NV (SUCTIONS) ×2 IMPLANT
WATER STERILE IRR 1000ML POUR (IV SOLUTION) ×4 IMPLANT

## 2020-12-02 NOTE — Anesthesia Preprocedure Evaluation (Addendum)
Anesthesia Evaluation  Patient identified by MRN, date of birth, ID band Patient awake    Reviewed: Allergy & Precautions, NPO status , Patient's Chart, lab work & pertinent test results  Airway Mallampati: II  TM Distance: >3 FB Neck ROM: Full    Dental  (+) Dental Advisory Given, Teeth Intact   Pulmonary sleep apnea , former smoker,    Pulmonary exam normal breath sounds clear to auscultation       Cardiovascular Exercise Tolerance: Good hypertension, Pt. on medications Normal cardiovascular exam Rhythm:Regular Rate:Normal     Neuro/Psych    GI/Hepatic GERD  Medicated and Controlled,  Endo/Other    Renal/GU      Musculoskeletal  (+) Arthritis , Osteoarthritis,    Abdominal (+) + obese,   Peds  Hematology  (+) Blood dyscrasia, anemia ,   Anesthesia Other Findings   Reproductive/Obstetrics                             Anesthesia Physical  Anesthesia Plan  ASA: 2  Anesthesia Plan: General   Post-op Pain Management:    Induction: Intravenous  PONV Risk Score and Plan: 2 and Ondansetron, Treatment may vary due to age or medical condition and Midazolam  Airway Management Planned: Oral ETT  Additional Equipment: None  Intra-op Plan:   Post-operative Plan: Extubation in OR  Informed Consent: I have reviewed the patients History and Physical, chart, labs and discussed the procedure including the risks, benefits and alternatives for the proposed anesthesia with the patient or authorized representative who has indicated his/her understanding and acceptance.     Dental advisory given  Plan Discussed with: CRNA  Anesthesia Plan Comments:        Anesthesia Quick Evaluation

## 2020-12-02 NOTE — Transfer of Care (Signed)
Immediate Anesthesia Transfer of Care Note  Patient: Gregory Norton  Procedure(s) Performed: revision left posterior total hip (Left: Hip)  Patient Location: PACU  Anesthesia Type:General  Level of Consciousness: awake and patient cooperative  Airway & Oxygen Therapy: Patient Spontanous Breathing and Patient connected to face mask oxygen  Post-op Assessment: Report given to RN, Post -op Vital signs reviewed and stable and Patient moving all extremities X 4  Post vital signs: stable  Last Vitals:  Vitals Value Taken Time  BP 172/88 12/02/20 1018  Temp    Pulse 72 12/02/20 1022  Resp 14 12/02/20 1022  SpO2 94 % 12/02/20 1022  Vitals shown include unvalidated device data.  Last Pain:  Vitals:   12/02/20 0553  TempSrc: Oral  PainSc:       Patients Stated Pain Goal: 2 (95/09/32 6712)  Complications: No notable events documented.

## 2020-12-02 NOTE — Anesthesia Procedure Notes (Signed)
Procedure Name: Intubation Date/Time: 12/02/2020 8:12 AM Performed by: Lissa Morales, CRNA Pre-anesthesia Checklist: Patient identified, Emergency Drugs available, Suction available and Patient being monitored Patient Re-evaluated:Patient Re-evaluated prior to induction Oxygen Delivery Method: Circle system utilized Preoxygenation: Pre-oxygenation with 100% oxygen Induction Type: IV induction Ventilation: Mask ventilation without difficulty and Oral airway inserted - appropriate to patient size Laryngoscope Size: Mac and 4 Grade View: Grade II Tube type: Oral Tube size: 8.0 mm Number of attempts: 1 Airway Equipment and Method: Stylet and Oral airway Placement Confirmation: ETT inserted through vocal cords under direct vision, positive ETCO2 and breath sounds checked- equal and bilateral Secured at: 22 cm Tube secured with: Tape Dental Injury: Teeth and Oropharynx as per pre-operative assessment

## 2020-12-02 NOTE — Plan of Care (Signed)
  Problem: Health Behavior/Discharge Planning: Goal: Ability to manage health-related needs will improve Outcome: Progressing   

## 2020-12-02 NOTE — Interval H&P Note (Signed)
History and Physical Interval Note:  12/02/2020 7:10 AM  Gregory Norton  has presented today for surgery, with the diagnosis of dislocated left hip.  The various methods of treatment have been discussed with the patient and family. After consideration of risks, benefits and other options for treatment, the patient has consented to  Procedure(s) with comments: revision left posterior total hip (Left) - peg board, depuy as a surgical intervention.  The patient's history has been reviewed, patient examined, no change in status, stable for surgery.  I have reviewed the patient's chart and labs.  Questions were answered to the patient's satisfaction.     [name]

## 2020-12-02 NOTE — Anesthesia Postprocedure Evaluation (Signed)
Anesthesia Post Note  Patient: ANDER WAMSER  Procedure(s) Performed: revision left posterior total hip (Left: Hip)     Patient location during evaluation: PACU Anesthesia Type: General Level of consciousness: awake and alert Pain management: pain level controlled Vital Signs Assessment: post-procedure vital signs reviewed and stable Respiratory status: spontaneous breathing, nonlabored ventilation and respiratory function stable Cardiovascular status: blood pressure returned to baseline and stable Postop Assessment: no apparent nausea or vomiting Anesthetic complications: no   No notable events documented.  Last Vitals:  Vitals:   12/02/20 1107 12/02/20 1123  BP:  134/81  Pulse: 64 69  Resp:  16  Temp:  36.7 C  SpO2: 95% 98%    Last Pain:  Vitals:   12/02/20 1123  TempSrc: Oral  PainSc:                  Lynda Rainwater

## 2020-12-02 NOTE — H&P (View-Only) (Signed)
Patient ID: Gregory Norton, male   DOB: 1950/07/13, 70 y.o.   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

## 2020-12-02 NOTE — Progress Notes (Signed)
Patient ID: Gregory Norton, male   DOB: Apr 26, 1950, 70 y.o.   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

## 2020-12-02 NOTE — Op Note (Signed)
NAME: Gregory Norton, DASHNER MEDICAL RECORD NO: [id number] ACCOUNT NO: [id number] DATE OF BIRTH: 1950-05-01 FACILITY: [facility] LOC[facility]: [id number] PHYSICIAN: Pietro Cassis. 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  PROCEDURE:  Revision left total hip arthroplasty.  COMPONENTS USED:  DePuy 52 x 36, +4, 10-degree face-changing liner, cemented into an old shell.  SURGEON:  Pietro Cassis. Alvan Dame, MD  ASSISTANT:  Costella Hatcher, PA-C.  Note that Ms. Lu Duffel was present for the entirety of the case from preoperative positioning, perioperative management of the operative extremity, general facilitation of the case, and primary wound closure.  ANESTHESIA:  General.  DRAINS:  None.  BLOOD LOSS:  200 mL.  COMPLICATIONS:  None.  INDICATIONS:  The patient is a very pleasant 70 year old male who was admitted to the hospital on 11/28/2020 for revision of his left hip due to failure of a liner cemented into a previous shell with recurrent instability.  At that point in time, we  underwent a revision surgery trying to place a new liner cemented into his old shell.  Based on multiple reasons as noted including the fact, but not limited to the fact that his previously placed 36+15 ball had cold welded to his trunnion and was unable  to be removed.  Immediately, postoperatively, there were issues regarding instability despite assessment of his range of motion in the OR and sense of stability.  He had recurrent dislocation.  I took him back to the operating room on Wednesday, the  16th, and closed reduced his hip.  On Thursday, when trying to work with physical therapy, there were recurrent concerns for instability confirmed by radiographs.  Based on OR time availability as well as staffing, I was unable to get him to the  operating room  until today, the 19th.  The plan for today was, and this was reviewed with him, to remove the previously done liner to assess whether or not it had dislodged and adjust in order to provide stability given the current constraints of the  system in place.  Risks of recurrent instability, infection, DVT were all discussed and reviewed.  Consent was obtained.  DESCRIPTION OF PROCEDURE:  The patient was brought to the operative theater.  Once adequate anesthesia, preoperative antibiotics, Ancef 3 g administered, he was positioned into the right lateral decubitus position with the left hip up.  The left lower  extremity was then prepped and draped in sterile fashion.  Timeout was performed identifying the patient, planned procedure, and extremity.  His old incision was utilized.  This was incised removing all previous sutures.  There was a minor subcutaneous  seroma, hematoma that was evacuated.  We then entered the joint posteriorly through the iliotibial band and gluteal fascia without evidence of prior dehiscence.  There was significant postoperative hematoma, seroma in this area.  This was evacuated.   Following some initial debridement of further scar tissue anteriorly, the exposure of the hip was as expected to be fairly easy based on the work that was done on Tuesday early in the week.  We did discuss with the DePuy representative some other options  and potentially trying to remove the femoral head from the trunnion; however, these devices did not seem to work based on likely the relationship of the +15 head ball on the trunnion previously there.  There was no way to get adequate  leverage in order  for the construct to remove the head.  For that reason, we were stuck with the game plan of trying to cement a new liner into place.  At this point, I assessed the current location of the liner.  I then removed the old liner and removed some of the  cement around the rim of the acetabulum.  I chose to leave  some of the cement inside the liner at this point to provide perhaps a little bit of lateralization to help with tension.  We then opened up the new liner and then evaluated the cup position that  we would like to have using a hip positioner trying to maximize forward flexion as well as abduction to try to provide stability.  Once I had this idea and orientation in mind visually, we mixed a batch of cement.  I scuffed up in a checked pattern the  backside of the polyethylene.  Once the cement was mixed and ready, we cemented this final liner holding in the position using the cup positioner to maintain forward flexion and abduction of the cup the way that we had visualized. Once the cement had  fully cured, the liner was stable without evidence of any movement to me directly trying to remove it by hand.  We then reduced the head into the ball.  I did spend some other time trying to remove further bone and soft tissues in the anterior aspect of  the hip.  We assessed range of motion a lot.  There was no evidence of impingement with extension and external rotation.  In the neutral position, there was no evidence of any shuck indicating adequate length.  The only real incidence of instability came  with hip flexion to 90 degrees and internal rotation over 10-15 degrees.  I felt that there was some evidence of trochanteric compartment on the ilium; however, there was no further bone or soft tissues that I could remove that seemed to be causing the  leverage.  I did feel that he should have some soft tissue feedback to prevent that motion.  We did note that in the sleep position the hip appeared to be more stable with the orientation of the new liner.  Given this assessment and what we have dealt  with over this past week, I felt that his hip was in a much more stable position and felt that it was adequate to close given the limitations of the current components in place and orientation of components.  The hip was  irrigated with about 2 liters of  normal saline solution with a pulse lavage.  We reapproximated the iliotibial band and gluteal fascia using #1 Vicryl and #1 Stratafix suture.  The remainder of the wound was closed in layers.  The wound was clean, dry, and dressed sterilely using  surgical glue and Aquacel dressing.  He was then transferred extubated to the recovery room with his leg in a neutral position, held in place with a bone foam device.  We will obtain postoperative radiographs to confirm stability of the hip.  We will then have physical therapy work with him in hopes that we can get him home tomorrow given the prolonged hospital stay related to this instability.  Given the  intraoperative findings and what we have dealt with, with his hip over the past few years, we have discussed with the DePuy representative other options, which they will look into.   [facility] D: 12/02/2020 10:11:19 am T:  12/02/2020 1:06:00 pm  JOB: [id number]/ [id number]

## 2020-12-02 NOTE — Brief Op Note (Signed)
12/02/2020  10:02 AM  PATIENT:  Gregory Norton  70 y.o. 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   Delay start of Pharmacological VTE agent (>24hrs) due to surgical blood loss or risk of bleeding: no

## 2020-12-03 ENCOUNTER — Encounter (HOSPITAL_COMMUNITY): Payer: Self-pay | Admitting: Orthopedic Surgery

## 2020-12-03 LAB — CBC
HCT: 38.3 % — ABNORMAL LOW (ref 39.0–52.0)
Hemoglobin: 12.7 g/dL — ABNORMAL LOW (ref 13.0–17.0)
MCH: 30.9 pg (ref 26.0–34.0)
MCHC: 33.2 g/dL (ref 30.0–36.0)
MCV: 93.2 fL (ref 80.0–100.0)
Platelets: 269 10*3/uL (ref 150–400)
RBC: 4.11 MIL/uL — ABNORMAL LOW (ref 4.22–5.81)
RDW: 12.8 % (ref 11.5–15.5)
WBC: 14.2 10*3/uL — ABNORMAL HIGH (ref 4.0–10.5)
nRBC: 0 % (ref 0.0–0.2)

## 2020-12-03 LAB — BASIC METABOLIC PANEL
Anion gap: 6 (ref 5–15)
BUN: 18 mg/dL (ref 8–23)
CO2: 30 mmol/L (ref 22–32)
Calcium: 8.2 mg/dL — ABNORMAL LOW (ref 8.9–10.3)
Chloride: 99 mmol/L (ref 98–111)
Creatinine, Ser: 1.01 mg/dL (ref 0.61–1.24)
GFR, Estimated: >60 mL/min (ref >60)
Glucose, Bld: 125 mg/dL — ABNORMAL HIGH (ref 70–99)
Potassium: 3.8 mmol/L (ref 3.5–5.1)
Sodium: 135 mmol/L (ref 135–145)

## 2020-12-03 NOTE — Progress Notes (Signed)
Physical Therapy Treatment Patient Details Name: Gregory Norton MRN: [id number] DOB: [birth year] Today's Date: 12/03/2020   History of Present Illness Pt s/p L THR revision 11/28/20 with post op dislocation and manipulation 11/29/20.  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 11/30/20 - hip dislocated while pt in bed.  12/02/20 - revision surgery    PT Comments    Pt in good spirits with pain well controlled and eager to attempt mobility.  Pt performed therex program with assist and noted L hip feels more stable.  Pt up to ambulate in halls with no complaints and again remarks that hip feels much more solid under him then on last attempt several days ago.  Recommendations for follow up therapy are one component of a multi-disciplinary discharge planning process, led by the attending physician.  Recommendations may be updated based on patient status, additional functional criteria and insurance authorization.  Follow Up Recommendations  Follow physician's recommendations for discharge plan and follow up therapies     Assistance Recommended at Discharge Intermittent Supervision/Assistance  Equipment Recommendations  None recommended by PT    Recommendations for Other Services       Precautions / Restrictions Precautions Precautions: Posterior Hip;Fall Precaution Comments: Per Dr Alvan Dame order - strict posterior THP with pt most at risk for dislocation in seated position with hip internally rotated Restrictions Weight Bearing Restrictions: No LLE Weight Bearing: Weight bearing as tolerated     Mobility  Bed Mobility Overal bed mobility: Needs Assistance Bed Mobility: Supine to Sit     Supine to sit: Min assist     General bed mobility comments: Increased time with cues for sequence and use of R LE to self assist.  Physical assist to manage L LE    Transfers Overall transfer level: Needs assistance Equipment used: Rolling walker (2 wheels) Transfers: Sit  to/from Stand Sit to Stand: Min assist           General transfer comment: cues for LE management and use of UEs to self assist    Ambulation/Gait Ambulation/Gait assistance: Min assist;Min guard Gait Distance (Feet): 200 Feet Assistive device: Rolling walker (2 wheels) Gait Pattern/deviations: Step-to pattern;Decreased step length - right;Decreased step length - left;Shuffle;Trunk flexed Gait velocity: decr     General Gait Details: cues for posture, position from RW and sequence   Stairs             Wheelchair Mobility    Modified Rankin (Stroke Patients Only)       Balance Overall balance assessment: Needs assistance Sitting-balance support: No upper extremity supported;Feet supported Sitting balance-Leahy Scale: Good     Standing balance support: Bilateral upper extremity supported Standing balance-Leahy Scale: Poor                              Cognition Arousal/Alertness: Awake/alert Behavior During Therapy: WFL for tasks assessed/performed Overall Cognitive Status: Within Functional Limits for tasks assessed                                          Exercises Total Joint Exercises Ankle Circles/Pumps: AROM;Both;15 reps;Supine Quad Sets: AROM;Both;10 reps;Supine Heel Slides: AAROM;Left;15 reps;Supine Hip ABduction/ADduction: AAROM;Left;Supine;15 reps    General Comments        Pertinent Vitals/Pain Pain Assessment: 0-10 Pain Score: 2  Pain Location:  L hip Pain Descriptors / Indicators: Aching;Sore Pain Intervention(s): Limited activity within patient's tolerance;Monitored during session;Premedicated before session;Ice applied    Home Living                          Prior Function            PT Goals (current goals can now be found in the care plan section) Acute Rehab PT Goals Patient Stated Goal: Regain IND PT Goal Formulation: With patient Time For Goal Achievement: 12/07/20 Potential to  Achieve Goals: Good Progress towards PT goals: Progressing toward goals    Frequency    7X/week      PT Plan Current plan remains appropriate    Co-evaluation              AM-PAC PT "6 Clicks" Mobility   Outcome Measure  Help needed turning from your back to your side while in a flat bed without using bedrails?: A Little Help needed moving from lying on your back to sitting on the side of a flat bed without using bedrails?: A Little Help needed moving to and from a bed to a chair (including a wheelchair)?: A Little Help needed standing up from a chair using your arms (e.g., wheelchair or bedside chair)?: A Little Help needed to walk in hospital room?: A Little Help needed climbing 3-5 steps with a railing? : A Lot 6 Click Score: 17    End of Session Equipment Utilized During Treatment: Gait belt Activity Tolerance: Patient tolerated treatment well Patient left: in chair;with call bell/phone within reach;with chair alarm set Nurse Communication: Mobility status PT Visit Diagnosis: Difficulty in walking, not elsewhere classified (R26.2)     Time: 8182-9937 PT Time Calculation (min) (ACUTE ONLY): 30 min  Charges:  $Gait Training: 8-22 mins $Therapeutic Exercise: 8-22 mins                     Debe Coder PT Acute Rehabilitation Services Pager [phone] Office [phone]    Eldine Rencher 12/03/2020, 3:07 PM

## 2020-12-03 NOTE — Plan of Care (Signed)
  Problem: Health Behavior/Discharge Planning: Goal: Ability to manage health-related needs will improve Outcome: Progressing   Problem: Activity: Goal: Risk for activity intolerance will decrease Outcome: Progressing   Problem: Pain Managment: Goal: General experience of comfort will improve Outcome: Progressing   

## 2020-12-03 NOTE — Progress Notes (Signed)
Patient ID: Gregory Norton, male   DOB: [birth year], 70 y.o.   MRN: [id number] Subjective: 1 Day Post-Op Procedure(s) (LRB): revision left posterior total hip (Left)    Patient reports pain as mild.  No events over night but no OOB activity at this point  Objective:   VITALS:   Vitals:   12/03/20 0255 12/03/20 0634  BP: 116/75 127/72  Pulse: 69 66  Resp: 18 18  Temp: 98.1 F (36.7 C) 97.9 F (36.6 C)  SpO2: 90% 96%    Neurovascular intact Incision: dressing C/D/I  LABS Recent Labs    12/03/20 0323  HGB 12.7*  HCT 38.3*  WBC 14.2*  PLT 269    Recent Labs    12/03/20 0323  NA 135  K 3.8  BUN 18  CREATININE 1.01  GLUCOSE 125*    No results for input(s): LABPT, INR in the last 72 hours.   Assessment/Plan: 1 Day Post-Op Procedure(s) (LRB): revision left posterior total hip (Left)   Advance diet Up with therapy I took him out of the bone foam I reviewed quad isometrics and heel slides I reviewed the intra-operative findings and the precautions associated mainly with deep hip flexion and internal rotation over 20 degrees as the main limitations I would like for him to work with PT today on bed mobility and walking If he does well and gains confidence after all that has transpired over this past week we will hopefully be able to get him home tomorrow

## 2020-12-03 NOTE — Plan of Care (Signed)
  Problem: Coping: Goal: Level of anxiety will decrease Outcome: Progressing   Problem: Pain Managment: Goal: General experience of comfort will improve Outcome: Progressing   

## 2020-12-03 NOTE — Progress Notes (Signed)
Physical Therapy Treatment Patient Details Name: Gregory Norton MRN: [id number] DOB: 1951/01/08 Today's Date: 12/03/2020   History of Present Illness Pt s/p L THR revision 11/28/20 with post op dislocation and manipulation 11/29/20.  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 11/30/20 - hip dislocated while pt in bed.  12/02/20 - revision surgery    PT Comments    Pt continues very motivated and progressing well with mobility but cues to slow down for safety and to insure adherence to THP.   Pt continues to report hip feels very stable.   Recommendations for follow up therapy are one component of a multi-disciplinary discharge planning process, led by the attending physician.  Recommendations may be updated based on patient status, additional functional criteria and insurance authorization.  Follow Up Recommendations  Follow physician's recommendations for discharge plan and follow up therapies     Assistance Recommended at Discharge Intermittent Supervision/Assistance  Equipment Recommendations  None recommended by PT    Recommendations for Other Services       Precautions / Restrictions Precautions Precautions: Posterior Hip;Fall Precaution Booklet Issued: Yes (comment) Precaution Comments: Per Dr Alvan Dame order - strict posterior THP with pt most at risk for dislocation in seated position with hip internally rotated Restrictions Weight Bearing Restrictions: No LLE Weight Bearing: Weight bearing as tolerated     Mobility  Bed Mobility Overal bed mobility: Needs Assistance Bed Mobility: Sit to Supine       Sit to supine: Min assist   General bed mobility comments: Increased time with cues for sequence and use of R LE to self assist.  Physical assist to manage L LE    Transfers Overall transfer level: Needs assistance Equipment used: Rolling walker (2 wheels) Transfers: Sit to/from Stand Sit to Stand: Min assist           General transfer  comment: cues for LE management and use of UEs to self assist    Ambulation/Gait Ambulation/Gait assistance: Min guard Gait Distance (Feet): 200 Feet Assistive device: Rolling walker (2 wheels) Gait Pattern/deviations: Step-to pattern;Decreased step length - right;Decreased step length - left;Shuffle;Trunk flexed       General Gait Details: Multiple standing rest breaks with cues for posture, position from RW and to slow pace for safety   Stairs             Wheelchair Mobility    Modified Rankin (Stroke Patients Only)       Balance Overall balance assessment: Needs assistance Sitting-balance support: No upper extremity supported;Feet supported Sitting balance-Leahy Scale: Good     Standing balance support: No upper extremity supported Standing balance-Leahy Scale: Fair                              Cognition Arousal/Alertness: Awake/alert Behavior During Therapy: WFL for tasks assessed/performed Overall Cognitive Status: Within Functional Limits for tasks assessed                                          Exercises      General Comments        Pertinent Vitals/Pain Pain Assessment: 0-10 Pain Score: 2  Pain Location: L hip Pain Descriptors / Indicators: Aching;Sore Pain Intervention(s): Limited activity within patient's tolerance;Monitored during session;Ice applied    Home Living  Prior Function            PT Goals (current goals can now be found in the care plan section) Acute Rehab PT Goals Patient Stated Goal: Regain IND PT Goal Formulation: With patient Time For Goal Achievement: 12/07/20 Potential to Achieve Goals: Good Progress towards PT goals: Progressing toward goals    Frequency    7X/week      PT Plan Current plan remains appropriate    Co-evaluation              AM-PAC PT "6 Clicks" Mobility   Outcome Measure  Help needed turning from your back to  your side while in a flat bed without using bedrails?: A Little Help needed moving from lying on your back to sitting on the side of a flat bed without using bedrails?: A Little Help needed moving to and from a bed to a chair (including a wheelchair)?: A Little Help needed standing up from a chair using your arms (e.g., wheelchair or bedside chair)?: A Little Help needed to walk in hospital room?: A Little Help needed climbing 3-5 steps with a railing? : A Lot 6 Click Score: 17    End of Session Equipment Utilized During Treatment: Gait belt Activity Tolerance: Patient tolerated treatment well Patient left: in bed;with call bell/phone within reach Nurse Communication: Mobility status PT Visit Diagnosis: Difficulty in walking, not elsewhere classified (R26.2)     Time: 0940-7680 PT Time Calculation (min) (ACUTE ONLY): 26 min  Charges:  $Gait Training: 23-37 mins                     [place] Pager [phone] Office [phone]    Valori Hollenkamp 12/03/2020, 4:33 PM

## 2020-12-04 LAB — CBC
HCT: 40 % (ref 39.0–52.0)
Hemoglobin: 13.3 g/dL (ref 13.0–17.0)
MCH: 30.2 pg (ref 26.0–34.0)
MCHC: 33.3 g/dL (ref 30.0–36.0)
MCV: 90.9 fL (ref 80.0–100.0)
Platelets: 293 10*3/uL (ref 150–400)
RBC: 4.4 MIL/uL (ref 4.22–5.81)
RDW: 12.8 % (ref 11.5–15.5)
WBC: 16.5 10*3/uL — ABNORMAL HIGH (ref 4.0–10.5)
nRBC: 0 % (ref 0.0–0.2)

## 2020-12-04 MED ORDER — METHOCARBAMOL 500 MG PO TABS: 500.0000 mg | ORAL_TABLET | Freq: Four times a day (QID) | ORAL | 0 refills | Status: AC | PRN

## 2020-12-04 MED ORDER — CELECOXIB 200 MG PO CAPS: 200.0000 mg | ORAL_CAPSULE | Freq: Two times a day (BID) | ORAL | 0 refills | Status: AC

## 2020-12-04 MED ORDER — CEPHALEXIN 500 MG PO CAPS: 500.0000 mg | ORAL_CAPSULE | Freq: Three times a day (TID) | ORAL | 0 refills | Status: AC

## 2020-12-04 MED ORDER — POLYETHYLENE GLYCOL 3350 17 G PO PACK: 17.0000 g | PACK | Freq: Every day | ORAL | 0 refills | Status: AC | PRN

## 2020-12-04 MED ORDER — OXYCODONE HCL 5 MG PO TABS: 5.0000 mg | ORAL_TABLET | Freq: Four times a day (QID) | ORAL | 0 refills | Status: AC | PRN

## 2020-12-04 MED ORDER — CEPHALEXIN 500 MG PO CAPS
500.0000 mg | ORAL_CAPSULE | Freq: Three times a day (TID) | ORAL | Status: DC
  Administered 2020-12-04 (×2): 500 mg via ORAL
  Filled 2020-12-04 (×2): qty 1

## 2020-12-04 MED ORDER — ASPIRIN 81 MG PO CHEW
81.0000 mg | CHEWABLE_TABLET | Freq: Two times a day (BID) | ORAL | 0 refills | Status: AC
Start: 2020-12-04 — End: 2021-01-01

## 2020-12-04 NOTE — Progress Notes (Signed)
Patient ID: Gregory Norton, male   DOB: May 31, 1950, 70 y.o.   MRN: [id number] Subjective: 2 Days Post-Op Procedure(s) (LRB): revision left posterior total hip (Left)    Patient reports pain as mild.  He was up with therapy yesterday and felt that his hip felt much more secure. He has not needed pain meds since yesterday only Robaxin.  Objective:   VITALS:   Vitals:   12/03/20 2029 12/04/20 0351  BP: (!) 150/81 130/77  Pulse: 70 68  Resp: 20 20  Temp: 98.4 F (36.9 C) 98.1 F (36.7 C)  SpO2: 95% 95%    Neurovascular intact Incision: dressing C/D/I  LABS Recent Labs    12/03/20 0323 12/04/20 0336  HGB 12.7* 13.3  HCT 38.3* 40.0  WBC 14.2* 16.5*  PLT 269 293    Recent Labs    12/03/20 0323  NA 135  K 3.8  BUN 18  CREATININE 1.01  GLUCOSE 125*    No results for input(s): LABPT, INR in the last 72 hours.   Assessment/Plan: 2 Days Post-Op Procedure(s) (LRB): revision left posterior total hip (Left)   Up with therapy If he does well with therapy again today we will work towards discharging to home this afternoon with follow up in 2 weeks for wound check Posterior hip precautions  Due to 2 operations in 1 week I will send him home on Keflex TID for a week as prophylaxis

## 2020-12-04 NOTE — Progress Notes (Signed)
Pt ready for Dc. Iv removed. Reviewed all DC instructions, medications, and follow up information. All questions answered. Collected patients belongings. Dc'd via wheelchair accompanied by staff.

## 2020-12-04 NOTE — Plan of Care (Signed)

## 2020-12-04 NOTE — Progress Notes (Signed)
Physical Therapy Treatment Patient Details Name: Gregory Norton MRN: [id number] DOB: [birth year] Today's Date: 12/04/2020   History of Present Illness Pt s/p L THR revision 11/28/20 with post op dislocation and manipulation 11/29/20.  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 11/30/20 - hip dislocated while pt in bed.  12/02/20 - revision surgery    PT Comments    Pt progressing well. Reviewed THP precautions thoroughly,  reviewed HEP and progression of exercises.  Discussed car transfers, mobilityat home using RW vs w/c  Recommendations for follow up therapy are one component of a multi-disciplinary discharge planning process, led by the attending physician.  Recommendations may be updated based on patient status, additional functional criteria and insurance authorization.  Follow Up Recommendations  Follow physician's recommendations for discharge plan and follow up therapies (HEP)     Assistance Recommended at Discharge Intermittent Supervision/Assistance  Equipment Recommendations  None recommended by PT    Recommendations for Other Services       Precautions / Restrictions Precautions Precautions: Posterior Hip;Fall Precaution Comments: Per Dr Alvan Dame order - strict posterior THP with pt most at risk for dislocation in seated position with hip internally rotated Restrictions LLE Weight Bearing: Weight bearing as tolerated     Mobility  Bed Mobility   Bed Mobility: Supine to Sit;Sit to Supine     Supine to sit: Supervision Sit to supine: Supervision   General bed mobility comments: Increased time with cues for sequence and reminders for THP    Transfers Overall transfer level: Needs assistance Equipment used: Rolling walker (2 wheels) Transfers: Sit to/from Stand Sit to Stand: Supervision           General transfer comment: cues for LE management and use of UEs to self assist, awareness of L hip angle, cues to slide L foot forward and lower  knee to incr ankle at hip. pt demonstrates good understanding of THP with specific regard to avoiding hip flexion and IR    Ambulation/Gait Ambulation/Gait assistance: Supervision Gait Distance (Feet): 220 Feet Assistive device: Rolling walker (2 wheels) Gait Pattern/deviations: Step-to pattern;Wide base of support       General Gait Details: pt with good carryover from previous sessions, maintains LLE in external rotation   Stairs Stairs:  (discussed, pt wife usually bumps him over threshold in w/c. pt describes technique for up/down steps with lofstrand crutche and rail; does not feel he needs to practice)           Wheelchair Mobility    Modified Rankin (Stroke Patients Only)       Balance     Sitting balance-Leahy Scale: Good     Standing balance support: No upper extremity supported Standing balance-Leahy Scale: Fair                              Cognition Arousal/Alertness: Awake/alert Behavior During Therapy: WFL for tasks assessed/performed Overall Cognitive Status: Within Functional Limits for tasks assessed                                          Exercises Total Joint Exercises Ankle Circles/Pumps: AROM;Both;5 reps Quad Sets: AROM;Both;5 reps    General Comments        Pertinent Vitals/Pain Pain Assessment: 0-10 Pain Score: 2  Pain Location: L hip Pain Descriptors /  Indicators: Aching;Sore Pain Intervention(s): Limited activity within patient's tolerance;Monitored during session;Premedicated before session;Repositioned    Home Living                          Prior Function            PT Goals (current goals can now be found in the care plan section) Acute Rehab PT Goals Patient Stated Goal: Regain IND PT Goal Formulation: With patient Time For Goal Achievement: 12/07/20 Potential to Achieve Goals: Good Progress towards PT goals: Progressing toward goals    Frequency    7X/week       PT Plan Current plan remains appropriate    Co-evaluation              AM-PAC PT "6 Clicks" Mobility   Outcome Measure  Help needed turning from your back to your side while in a flat bed without using bedrails?: A Little Help needed moving from lying on your back to sitting on the side of a flat bed without using bedrails?: None Help needed moving to and from a bed to a chair (including a wheelchair)?: A Little Help needed standing up from a chair using your arms (e.g., wheelchair or bedside chair)?: A Little Help needed to walk in hospital room?: A Little Help needed climbing 3-5 steps with a railing? : A Little 6 Click Score: 19    End of Session Equipment Utilized During Treatment: Gait belt Activity Tolerance: Patient tolerated treatment well Patient left: in bed;with call bell/phone within reach Nurse Communication: Mobility status PT Visit Diagnosis: Difficulty in walking, not elsewhere classified (R26.2)     Time: 9169-4503 PT Time Calculation (min) (ACUTE ONLY): 30 min  Charges:  $Gait Training: 8-22 mins $Therapeutic Activity: 8-22 mins                     Baxter Flattery, PT  Acute Rehab Dept ([place]) [phone] Pager [phone]  12/04/2020    [facility] 12/04/2020, 11:17 AM

## 2020-12-04 NOTE — Plan of Care (Signed)
  Problem: Activity: Goal: Risk for activity intolerance will decrease Outcome: Progressing   Problem: Pain Managment: Goal: General experience of comfort will improve Outcome: Progressing   Problem: Safety: Goal: Ability to remain free from injury will improve Outcome: Progressing   

## 2020-12-04 NOTE — Progress Notes (Signed)
Physical Therapy Treatment Patient Details Name: Gregory Norton MRN: [id number] DOB: [birth year] Today's Date: 12/04/2020   History of Present Illness Pt s/p L THR revision 11/28/20 with post op dislocation and manipulation 11/29/20.  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 11/30/20 - hip dislocated while pt in bed.  12/02/20 - revision surgery    PT Comments    Pt progressing well. Seen this pm for safety and correct positioning for w/c transport to car. Reviewed car transfers/hip positioning and precautions with pt and RN transporting pt.     Recommendations for follow up therapy are one component of a multi-disciplinary discharge planning process, led by the attending physician.  Recommendations may be updated based on patient status, additional functional criteria and insurance authorization.  Follow Up Recommendations  Follow physician's recommendations for discharge plan and follow up therapies (HEP)     Assistance Recommended at Discharge Intermittent Supervision/Assistance  Equipment Recommendations  None recommended by PT    Recommendations for Other Services       Precautions / Restrictions Precautions Precautions: Posterior Hip;Fall Precaution Comments: Per Dr Alvan Dame order - strict posterior THP with pt most at risk for dislocation in seated position with hip internally rotated Restrictions LLE Weight Bearing: Weight bearing as tolerated     Mobility  Bed Mobility               General bed mobility comments: in recliner    Transfers Overall transfer level: Needs assistance Equipment used: Rolling walker (2 wheels) Transfers: Sit to/from Stand Sit to Stand: Supervision           General transfer comment: cues for LE management and use of UEs to self assist, awareness of L hip angle and hip external rotation; transfer to w/c--bulit up with pillows and blankets to avoid hip flexion past 90 degrees, ELR extended to longest position     Ambulation/Gait Ambulation/Gait assistance: Supervision Gait Distance (Feet): 14 Feet Assistive device: Rolling walker (2 wheels) Gait Pattern/deviations: Step-to pattern;Wide base of support       General Gait Details: pt with good carryover from previous sessions, maintains LLE in external rotation   Stairs             Wheelchair Mobility    Modified Rankin (Stroke Patients Only)       Balance     Sitting balance-Leahy Scale: Good     Standing balance support: No upper extremity supported Standing balance-Leahy Scale: Fair                              Cognition Arousal/Alertness: Awake/alert Behavior During Therapy: WFL for tasks assessed/performed Overall Cognitive Status: Within Functional Limits for tasks assessed                                          Exercises      General Comments        Pertinent Vitals/Pain Pain Assessment: No/denies pain Pain Intervention(s): Monitored during session    Home Living                          Prior Function            PT Goals (current goals can now be found in the care plan section) Acute  Rehab PT Goals Patient Stated Goal: Regain IND PT Goal Formulation: With patient Time For Goal Achievement: 12/07/20 Potential to Achieve Goals: Good Progress towards PT goals: Progressing toward goals    Frequency    7X/week      PT Plan Current plan remains appropriate    Co-evaluation              AM-PAC PT "6 Clicks" Mobility   Outcome Measure  Help needed turning from your back to your side while in a flat bed without using bedrails?: A Little Help needed moving from lying on your back to sitting on the side of a flat bed without using bedrails?: None Help needed moving to and from a bed to a chair (including a wheelchair)?: A Little Help needed standing up from a chair using your arms (e.g., wheelchair or bedside chair)?: A Little Help needed to  walk in hospital room?: A Little Help needed climbing 3-5 steps with a railing? : A Little 6 Click Score: 19    End of Session Equipment Utilized During Treatment: Gait belt Activity Tolerance: Patient tolerated treatment well Patient left: in bed;with call bell/phone within reach Nurse Communication: Mobility status PT Visit Diagnosis: Difficulty in walking, not elsewhere classified (R26.2)     Time: 8469-6295 PT Time Calculation (min) (ACUTE ONLY): 12 min  Charges:                        Baxter Flattery, PT  Acute Rehab Dept ([place] Pager [phone]  12/04/2020    [facility] 12/04/2020, 3:05 PM

## 2020-12-19 NOTE — Discharge Summary (Signed)
Physician Discharge Summary   Patient ID: WELTON BORD MRN: [id number] DOB/AGE: 1950-06-05 70 y.o.  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  Admission Diagnoses:  Past Medical History:  Diagnosis Date   Arthritis    Cancer ([place]) 2014   prostate cancer   Complication of anesthesia    low O2 sats, could not wake up after hip surgery   GERD (gastroesophageal reflux disease)    Hypercholesteremia    Hypertension    Osteoporosis    Pre-diabetes    Sleep apnea    no CPAP Pt lost wt   Discharge Diagnoses:   Principal Problem:   S/P left TH revision  Estimated body mass index is 36.27 kg/m as calculated from the following:   Height as of this encounter: 6\' 1"  (1.854 m).   Weight as of this encounter: 124.7 kg.  Procedure:  Procedure(s) (LRB): revision left posterior total hip (Left)   Consults: None  HPI: The patient is a very pleasant 70 year old male who was admitted to the hospital on 11/28/2020 for revision of his left hip due to failure of a liner cemented into a previous shell with recurrent instability.  At that point in time, we  underwent a revision surgery trying to place a new liner cemented into his old shell.  Based on multiple reasons as noted including the fact, but not limited to the fact that his previously placed 36+15 ball had cold welded to his trunnion and was unable  to be removed.  Immediately, postoperatively, there were issues regarding instability despite assessment of his range of motion in the OR and sense of stability.  He had recurrent dislocation.  I took him back to the operating room on Wednesday, the  16th, and closed reduced his hip.  On Thursday, when trying to work with physical therapy, there were recurrent concerns for instability confirmed by radiographs.  Based on OR time availability as well as staffing, I was unable to get him to the  operating room  until today, the 19th.  The plan for today was, and this was reviewed with him, to remove the previously done liner to assess whether or not it had dislodged and adjust in order to provide stability given the current constraints of the  system in place.  Risks of recurrent instability, infection, DVT were all discussed and reviewed.  Consent was obtained.  Laboratory Data: Admission on 11/28/2020, Discharged on 12/04/2020  Component Date Value Ref Range Status   Hgb A1c MFr Bld 11/24/2020 5.5  4.8 - 5.6 % Final   Comment: (NOTE) Pre diabetes:          5.7%-6.4%  Diabetes:              >6.4%  Glycemic control for   <7.0% adults with diabetes    Mean Plasma Glucose 11/24/2020 111.15  mg/dL Final   Performed at [facility] [address] [place], [place] [address]   WBC 11/29/2020 16.5 (H)  4.0 - 10.5 K/uL Final   RBC 11/29/2020 4.64  4.22 - 5.81 MIL/uL Final   Hemoglobin 11/29/2020 14.2  13.0 - 17.0 g/dL Final   HCT 11/29/2020 42.1  39.0 - 52.0 % Final   MCV 11/29/2020 90.7  80.0 - 100.0 fL Final   MCH 11/29/2020 30.6  26.0 - 34.0 pg Final   MCHC 11/29/2020 33.7  30.0 - 36.0 g/dL Final   RDW 11/29/2020 13.2  11.5 -  15.5 % Final   Platelets 11/29/2020 252  150 - 400 K/uL Final   nRBC 11/29/2020 0.0  0.0 - 0.2 % Final   Performed at [facility], [place] [address], [place], [place] [address]   Sodium 11/29/2020 137  135 - 145 mmol/L Final   Potassium 11/29/2020 4.2  3.5 - 5.1 mmol/L Final   Chloride 11/29/2020 98  98 - 111 mmol/L Final   CO2 11/29/2020 29  22 - 32 mmol/L Final   Glucose, Bld 11/29/2020 185 (H)  70 - 99 mg/dL Final   Glucose reference range applies only to samples taken after fasting for at least 8 hours.   BUN 11/29/2020 16  8 - 23 mg/dL Final   Creatinine, Ser 11/29/2020 0.93  0.61 - 1.24 mg/dL Final   Calcium 11/29/2020 9.2  8.9 - 10.3 mg/dL Final   GFR, Estimated 11/29/2020 >60  >60 mL/min Final   Comment: (NOTE) Calculated using the  CKD-EPI Creatinine Equation (2021)    Anion gap 11/29/2020 10  5 - 15 Final   Performed at [facility], [place] [address], [place], [place] [address]   WBC 11/30/2020 14.6 (H)  4.0 - 10.5 K/uL Final   RBC 11/30/2020 4.52  4.22 - 5.81 MIL/uL Final   Hemoglobin 11/30/2020 13.6  13.0 - 17.0 g/dL Final   HCT 11/30/2020 41.5  39.0 - 52.0 % Final   MCV 11/30/2020 91.8  80.0 - 100.0 fL Final   MCH 11/30/2020 30.1  26.0 - 34.0 pg Final   MCHC 11/30/2020 32.8  30.0 - 36.0 g/dL Final   RDW 11/30/2020 13.2  11.5 - 15.5 % Final   Platelets 11/30/2020 235  150 - 400 K/uL Final   nRBC 11/30/2020 0.0  0.0 - 0.2 % Final   Performed at [facility], [place] [address], [place], [place] [address]   MRSA by PCR Next Gen 12/01/2020 NOT DETECTED  NOT DETECTED Final   Comment: (NOTE) The GeneXpert MRSA Assay (FDA approved for NASAL specimens only), is one component of a comprehensive MRSA colonization surveillance program. It is not intended to diagnose MRSA infection nor to guide or monitor treatment for MRSA infections. Test performance is not FDA approved in patients less than 30 years old. Performed at [facility], [place] [address] [place], [place] [address]    WBC 12/03/2020 14.2 (H)  4.0 - 10.5 K/uL Final   RBC 12/03/2020 4.11 (L)  4.22 - 5.81 MIL/uL Final   Hemoglobin 12/03/2020 12.7 (L)  13.0 - 17.0 g/dL Final   HCT 12/03/2020 38.3 (L)  39.0 - 52.0 % Final   MCV 12/03/2020 93.2  80.0 - 100.0 fL Final   MCH 12/03/2020 30.9  26.0 - 34.0 pg Final   MCHC 12/03/2020 33.2  30.0 - 36.0 g/dL Final   RDW 12/03/2020 12.8  11.5 - 15.5 % Final   Platelets 12/03/2020 269  150 - 400 K/uL Final   nRBC 12/03/2020 0.0  0.0 - 0.2 % Final   Performed at [facility], [place] [address], [place], [place] [address]   Sodium 12/03/2020 135  135 - 145 mmol/L Final   Potassium 12/03/2020 3.8  3.5 - 5.1 mmol/L Final   Chloride 12/03/2020 99  98 - 111  mmol/L Final   CO2 12/03/2020 30  22 - 32 mmol/L Final   Glucose, Bld 12/03/2020 125 (H)  70 - 99 mg/dL Final   Glucose reference range applies only to  samples taken after fasting for at least 8 hours.   BUN 12/03/2020 18  8 - 23 mg/dL Final   Creatinine, Ser 12/03/2020 1.01  0.61 - 1.24 mg/dL Final   Calcium 12/03/2020 8.2 (L)  8.9 - 10.3 mg/dL Final   GFR, Estimated 12/03/2020 >60  >60 mL/min Final   Comment: (NOTE) Calculated using the CKD-EPI Creatinine Equation (2021)    Anion gap 12/03/2020 6  5 - 15 Final   Performed at [facility], [place] [address], [place], [place] [address]   WBC 12/04/2020 16.5 (H)  4.0 - 10.5 K/uL Final   RBC 12/04/2020 4.40  4.22 - 5.81 MIL/uL Final   Hemoglobin 12/04/2020 13.3  13.0 - 17.0 g/dL Final   HCT 12/04/2020 40.0  39.0 - 52.0 % Final   MCV 12/04/2020 90.9  80.0 - 100.0 fL Final   MCH 12/04/2020 30.2  26.0 - 34.0 pg Final   MCHC 12/04/2020 33.3  30.0 - 36.0 g/dL Final   RDW 12/04/2020 12.8  11.5 - 15.5 % Final   Platelets 12/04/2020 293  150 - 400 K/uL Final   nRBC 12/04/2020 0.0  0.0 - 0.2 % Final   Performed at [facility], [place] [address] [place], [place] [address]  Hospital Outpatient Visit on 11/24/2020  Component Date Value Ref Range Status   WBC 11/24/2020 9.5  4.0 - 10.5 K/uL Final   RBC 11/24/2020 5.15  4.22 - 5.81 MIL/uL Final   Hemoglobin 11/24/2020 15.9  13.0 - 17.0 g/dL Final   HCT 11/24/2020 47.4  39.0 - 52.0 % Final   MCV 11/24/2020 92.0  80.0 - 100.0 fL Final   MCH 11/24/2020 30.9  26.0 - 34.0 pg Final   MCHC 11/24/2020 33.5  30.0 - 36.0 g/dL Final   RDW 11/24/2020 13.4  11.5 - 15.5 % Final   Platelets 11/24/2020 268  150 - 400 K/uL Final   nRBC 11/24/2020 0.0  0.0 - 0.2 % Final   Performed at [facility], [place] [address] [place], [place] [address]   Sodium 11/24/2020 138  135 - 145 mmol/L Final   Potassium 11/24/2020 4.4  3.5 - 5.1 mmol/L Final   Chloride 11/24/2020  103  98 - 111 mmol/L Final   CO2 11/24/2020 28  22 - 32 mmol/L Final   Glucose, Bld 11/24/2020 102 (H)  70 - 99 mg/dL Final   Glucose reference range applies only to samples taken after fasting for at least 8 hours.   BUN 11/24/2020 19  8 - 23 mg/dL Final   Creatinine, Ser 11/24/2020 0.83  0.61 - 1.24 mg/dL Final   Calcium 11/24/2020 8.9  8.9 - 10.3 mg/dL Final   Total Protein 11/24/2020 7.8  6.5 - 8.1 g/dL Final   Albumin 11/24/2020 4.1  3.5 - 5.0 g/dL Final   AST 11/24/2020 18  15 - 41 U/L Final   ALT 11/24/2020 24  0 - 44 U/L Final   Alkaline Phosphatase 11/24/2020 54  38 - 126 U/L Final   Total Bilirubin 11/24/2020 0.7  0.3 - 1.2 mg/dL Final   GFR, Estimated 11/24/2020 >60  >60 mL/min Final   Comment: (NOTE) Calculated using the CKD-EPI Creatinine Equation (2021)    Anion gap 11/24/2020 7  5 - 15 Final   Performed at [facility], [place] [address], [place], [place] [address]   ABO/RH(D) 11/24/2020 A POS   Final   Antibody Screen 11/24/2020 NEG   Final  Sample Expiration 11/24/2020 12/01/2020,2359   Final   Extend sample reason 11/24/2020    Final                   Value:NO TRANSFUSIONS OR PREGNANCY IN THE PAST 3 MONTHS Performed at [facility], [place] [address], [place], [place] [address]    MRSA, PCR 11/24/2020 NEGATIVE  NEGATIVE Final   Staphylococcus aureus 11/24/2020 NEGATIVE  NEGATIVE Final   Comment: (NOTE) The Xpert SA Assay (FDA approved for NASAL specimens in patients 60 years of age and older), is one component of a comprehensive surveillance program. It is not intended to diagnose infection nor to guide or monitor treatment. Performed at [facility], [place] [address] [place], [place] [address]   Orders Only on 11/24/2020  Component Date Value Ref Range Status   SARS Coronavirus 2 11/24/2020 RESULT: NEGATIVE   Final   Comment: RESULT: NEGATIVESARS-CoV-2 INTERPRETATION:A NEGATIVE  test result means that  SARS-CoV-2 RNA was not present in the specimen above the limit of detection of this test. This does not preclude a possible SARS-CoV-2 infection and should not be used as the  sole basis for patient management decisions. Negative results must be combined with clinical observations, patient history, and epidemiological information. Optimum specimen types and timing for peak viral levels during infections caused by SARS-CoV-2  have not been determined. Collection of multiple specimens or types of specimens may be necessary to detect virus. Improper specimen collection and handling, sequence variability under primers/probes, or organism present below the limit of detection may  lead to false negative results. Positive and negative predictive values of testing are highly dependent on prevalence. False negative test results are more likely when prevalence of disease is high.The expected result is NEGATIVE.Fact S                          heet for  Healthcare Providers: LocalChronicle.no Sheet for Patients: SalonLookup.es Reference Range - Negative      X-Rays:DG Pelvis Portable  Result Date: 12/02/2020 CLINICAL DATA:  Status post left hip arthroplasty revision. EXAM: PORTABLE PELVIS 1-2 VIEWS COMPARISON:  November 30, 2020 FINDINGS: The dislocation from earlier today has been reduced. Air in the soft tissues is consistent with the history arthroplasty revision. Hardware is in good position. No other changes. IMPRESSION: Left hip arthroplasty revision as above. Electronically Signed   By: Dorise Bullion III M.D.   On: 12/02/2020 19:22   DG Pelvis Portable  Result Date: 11/30/2020 CLINICAL DATA:  Left hip pop EXAM: PORTABLE PELVIS 1-2 VIEWS COMPARISON:  X-ray dated November 30, 2020 at 224 FINDINGS: There is no evidence of pelvic fracture or diastasis. Superior dislocation of the femoral head hardware component. No pelvic bone lesions are seen.  IMPRESSION: New superior dislocation of the left femoral head hardware component. Electronically Signed   By: Yetta Glassman M.D.   On: 11/30/2020 16:44   DG Pelvis Portable  Result Date: 11/30/2020 CLINICAL DATA:  Confirm left hip placement. EXAM: PORTABLE PELVIS 1-2 VIEWS COMPARISON:  Radiograph yesterday. FINDINGS: Femoral head component of left hip arthroplasty is well aligned with the acetabular component. Cerclage wires in place. No visualized fracture. The distal most aspect of the femoral stem is not entirely included in the field of view. Remainder of the bony pelvis is unchanged IMPRESSION: Femoral head component of left hip arthroplasty is well aligned with acetabular component. No visualized fracture. Electronically Signed   By: Threasa Beards  San[place] M.D.   On: 11/30/2020 14:54   DG Pelvis Portable  Result Date: 11/28/2020 CLINICAL DATA:  Status post left hip arthroplasty. EXAM: PORTABLE PELVIS 1-2 VIEWS COMPARISON:  11/18/2019 FINDINGS: Left hip arthroplasty. The femoral head component is displaced from the acetabular component superolaterally. No periprosthetic fracture is seen. There are cerclage wires about the proximal femoral stem. The remainder of the included pelvis is intact. IMPRESSION: Left hip arthroplasty with dislocation of the femoral head component superolaterally with respect to the acetabular component. These results will be called to the ordering clinician or representative by the Radiologist Assistant, and communication documented in the PACS or [employer]. Electronically Signed   By: Keith Rake M.D.   On: 11/28/2020 19:50   DG HIP UNILAT WITH PELVIS 1V LEFT  Result Date: 11/29/2020 CLINICAL DATA:  Left hip prosthesis manipulation in the operating room. EXAM: DG HIP (WITH OR WITHOUT PELVIS) 1V*L* COMPARISON:  11/28/2020 and 01/01/2014. FINDINGS: Single submitted image shows the femoral component well aligned with the acetabular component consistent with  successful reduction of the left hip prosthesis dislocation. No fracture.  No evidence of loosening of the prosthetic components. IMPRESSION: Successful reduction of the dislocated left hip prosthesis. Electronically Signed   By: Lajean Manes M.D.   On: 11/29/2020 14:27    EKG: Orders placed or performed during the hospital encounter of 08/17/20   EKG 12-Lead   EKG 12-Lead   EKG 12-Lead   EKG 12-Lead     Hospital Course: Gregory Norton is a 70 y.o. who was admitted to [facility]. They were brought to the operating room on 12/02/2020 and underwent Procedure(s): revision left posterior total hip.  Patient tolerated the procedure well and was later transferred to the recovery room and then to the orthopaedic floor for postoperative care. They were given PO and IV analgesics for pain control following their surgery. They were given 24 hours of postoperative antibiotics of  Anti-infectives (From admission, onward)    Start     Dose/Rate Route Frequency Ordered Stop   12/04/20 0830  cephALEXin (KEFLEX) capsule 500 mg  Status:  Discontinued        500 mg Oral Every 8 hours 12/04/20 0736 12/04/20 2050   12/04/20 0000  cephALEXin (KEFLEX) 500 MG capsule        500 mg Oral Every 8 hours 12/04/20 1405 12/11/20 2359   12/02/20 1200  ceFAZolin (ANCEF) IVPB 2g/100 mL premix        2 g 200 mL/hr over 30 Minutes Intravenous Every 6 hours 12/02/20 1129 12/02/20 2233   12/02/20 0915  ceFAZolin (ANCEF) IVPB 3g/100 mL premix  Status:  Discontinued        3 g 200 mL/hr over 30 Minutes Intravenous On call to O.R. 12/02/20 0909 12/02/20 1112   12/02/20 0800  ceFAZolin (ANCEF) IVPB 3g/100 mL premix  Status:  Discontinued        3 g 200 mL/hr over 30 Minutes Intravenous To Surgery 12/02/20 0749 12/02/20 1129   12/02/20 0752  ceFAZolin (ANCEF) 3-0.9 GM/100ML-% IVPB       Note to Pharmacy: Hassie Bruce   : cabinet override      12/02/20 0752 12/02/20 1959   11/29/20 0600  ceFAZolin (ANCEF) IVPB  2g/100 mL premix        2 g 200 mL/hr over 30 Minutes Intravenous On call to O.R. 11/28/20 1342 11/28/20 1552   11/28/20 2200  ceFAZolin (ANCEF) IVPB 2g/100 mL premix  2 g 200 mL/hr over 30 Minutes Intravenous Every 6 hours 11/28/20 2053 11/29/20 0948      and started on DVT prophylaxis in the form of Aspirin.   PT and OT were ordered for total joint protocol. Discharge planning consulted to help with postop disposition and equipment needs. On the night of surgery, post-op x-rays taken reveal hip dislocation. Dr. Alvan Dame took him back to the OR on POD #1 (11/16) for closed reduction. This was successful. On the following morning, patient was doing well. He got up with PT and felt a shift in his hip. X-rays showed recurrent dislocation. Dr. Alvan Dame discussed with him need for repeat revision surgery to address this instability. He went to the OR on 12/02/20 for revision left total hip arthroplasty.   They started to get up OOB with therapy on POD #1.  Continued to work with therapy into POD #2. Pt was seen during rounds on day two and was ready to go home pending progress with therapy. Pt worked with therapy for two additional sessions and was meeting their goals. He was discharged to home later that day in stable condition.  Diet: Regular diet Activity: WBAT Follow-up: in 2 weeks Disposition: Home Discharged Condition: good   Discharge Instructions     Call MD / Call 911   Complete by: As directed    If you experience chest pain or shortness of breath, CALL 911 and be transported to the hospital emergency room.  If you develope a fever above 101 F, pus (white drainage) or increased drainage or redness at the wound, or calf pain, call your surgeon's office.   Change dressing   Complete by: As directed    Maintain surgical dressing until follow up in the clinic. If the edges start to pull up, may reinforce with tape. If the dressing is no longer working, may remove and cover with gauze and  tape, but must keep the area dry and clean.  Call with any questions or concerns.   Constipation Prevention   Complete by: As directed    Drink plenty of fluids.  Prune juice may be helpful.  You may use a stool softener, such as Colace (over the counter) 100 mg twice a day.  Use MiraLax (over the counter) for constipation as needed.   Diet - low sodium heart healthy   Complete by: As directed    Follow the hip precautions as taught in Physical Therapy   Complete by: As directed    Increase activity slowly as tolerated   Complete by: As directed    Weight bearing as tolerated with assist device (walker, cane, etc) as directed, use it as long as suggested by your surgeon or therapist, typically at least 4-6 weeks.   Post-operative opioid taper instructions:   Complete by: As directed    POST-OPERATIVE OPIOID TAPER INSTRUCTIONS: It is important to wean off of your opioid medication as soon as possible. If you do not need pain medication after your surgery it is ok to stop day one. Opioids include: Codeine, Hydrocodone(Norco, Vicodin), Oxycodone(Percocet, oxycontin) and hydromorphone amongst others.  Long term and even short term use of opiods can cause: Increased pain response Dependence Constipation Depression Respiratory depression And more.  Withdrawal symptoms can include Flu like symptoms Nausea, vomiting And more Techniques to manage these symptoms Hydrate well Eat regular healthy meals Stay active Use relaxation techniques(deep breathing, meditating, yoga) Do Not substitute Alcohol to help with tapering If you have been on opioids for  less than two weeks and do not have pain than it is ok to stop all together.  Plan to wean off of opioids This plan should start within one week post op of your joint replacement. Maintain the same interval or time between taking each dose and first decrease the dose.  Cut the total daily intake of opioids by one tablet each day Next start to  increase the time between doses. The last dose that should be eliminated is the evening dose.      TED hose   Complete by: As directed    Use stockings (TED hose) for 2 weeks on both leg(s).  You may remove them at night for sleeping.      Allergies as of 12/04/2020   No Known Allergies      Medication List     STOP taking these medications    docusate sodium 100 MG capsule Commonly known as: Colace   naproxen 500 MG tablet Commonly known as: Naprosyn   ondansetron 4 MG tablet Commonly known as: Zofran   oxyCODONE-acetaminophen 5-325 MG tablet Commonly known as: Percocet       TAKE these medications    amLODipine 5 MG tablet Commonly known as: NORVASC Take 5 mg by mouth daily.   ascorbic acid 500 MG tablet Commonly known as: VITAMIN C Take 500 mg by mouth daily.   aspirin 81 MG chewable tablet Chew 1 tablet (81 mg total) by mouth 2 (two) times daily for 28 days.   celecoxib 200 MG capsule Commonly known as: CELEBREX Take 1 capsule (200 mg total) by mouth 2 (two) times daily.   methocarbamol 500 MG tablet Commonly known as: ROBAXIN Take 1 tablet (500 mg total) by mouth every 6 (six) hours as needed for muscle spasms. What changed:  how much to take when to take this   multivitamin with minerals Tabs tablet Take 1 tablet by mouth every evening.   oxyCODONE 5 MG immediate release tablet Commonly known as: Oxy IR/ROXICODONE Take 1-2 tablets (5-10 mg total) by mouth every 6 (six) hours as needed for severe pain.   pantoprazole 40 MG tablet Commonly known as: PROTONIX Take 40 mg by mouth every morning.   polyethylene glycol 17 g packet Commonly known as: MIRALAX / GLYCOLAX Take 17 g by mouth daily as needed for mild constipation. What changed:  when to take this reasons to take this   simvastatin 10 MG tablet Commonly known as: ZOCOR Take 10 mg by mouth every evening.       ASK your doctor about these medications    cephALEXin 500 MG  capsule Commonly known as: KEFLEX Take 1 capsule (500 mg total) by mouth every 8 (eight) hours for 7 days. Ask about: Should I take this medication?               Discharge Care Instructions  (From admission, onward)           Start     Ordered   12/04/20 0000  Change dressing       Comments: Maintain surgical dressing until follow up in the clinic. If the edges start to pull up, may reinforce with tape. If the dressing is no longer working, may remove and cover with gauze and tape, but must keep the area dry and clean.  Call with any questions or concerns.   12/04/20 1405            Follow-up Information     Paralee Cancel,  MD. Schedule an appointment as soon as possible for a visit in 2 week(s).   Specialty: Orthopedic Surgery Contact information: [address] [place] [address] [phone]                 Signed: Griffith Citron, PA-C Orthopedic Surgery 12/19/2020, 4:21 PM
